# Patient Record
Sex: Male | Born: 1985 | Race: White | Hispanic: No | Marital: Married | Smoking: Former smoker
Health system: Southern US, Community
[De-identification: ages and names within clinical notes are randomized; demographics above are authoritative.]

## PROBLEM LIST (undated history)

## (undated) DIAGNOSIS — I10 Essential (primary) hypertension: Secondary | ICD-10-CM

## (undated) DIAGNOSIS — F329 Major depressive disorder, single episode, unspecified: Secondary | ICD-10-CM

## (undated) DIAGNOSIS — F32A Depression, unspecified: Secondary | ICD-10-CM

## (undated) DIAGNOSIS — M25569 Pain in unspecified knee: Secondary | ICD-10-CM

## (undated) DIAGNOSIS — M549 Dorsalgia, unspecified: Secondary | ICD-10-CM

## (undated) DIAGNOSIS — F319 Bipolar disorder, unspecified: Secondary | ICD-10-CM

## (undated) DIAGNOSIS — F419 Anxiety disorder, unspecified: Secondary | ICD-10-CM

## (undated) HISTORY — DX: Pain in unspecified knee: M25.569

## (undated) HISTORY — PX: TESTICLE SURGERY: SHX794

## (undated) HISTORY — PX: ROTATOR CUFF REPAIR: SHX139

## (undated) HISTORY — DX: Bipolar disorder, unspecified: F31.9

## (undated) HISTORY — DX: Essential (primary) hypertension: I10

## (undated) HISTORY — PX: TONSILLECTOMY: SUR1361

## (undated) HISTORY — DX: Dorsalgia, unspecified: M54.9

---
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
## 2007-03-25 ENCOUNTER — Emergency Department (HOSPITAL_COMMUNITY): Admission: EM | Admit: 2007-03-25 | Discharge: 2007-03-26 | Payer: Self-pay | Admitting: Emergency Medicine

## 2011-05-08 ENCOUNTER — Ambulatory Visit: Payer: Self-pay | Admitting: Family Medicine

## 2011-06-12 ENCOUNTER — Ambulatory Visit (INDEPENDENT_AMBULATORY_CARE_PROVIDER_SITE_OTHER): Payer: 59 | Admitting: Family Medicine

## 2011-06-12 ENCOUNTER — Encounter: Payer: Self-pay | Admitting: Family Medicine

## 2011-06-12 DIAGNOSIS — E291 Testicular hypofunction: Secondary | ICD-10-CM

## 2011-06-12 DIAGNOSIS — Z0000 Encounter for general adult medical examination without abnormal findings: Secondary | ICD-10-CM

## 2011-06-12 LAB — CBC WITH DIFFERENTIAL/PLATELET
Basophils Relative: 0.3 % (ref 0.0–3.0)
Lymphocytes Relative: 24.2 % (ref 12.0–46.0)
Lymphs Abs: 2.2 10*3/uL (ref 0.7–4.0)
Monocytes Absolute: 0.5 10*3/uL (ref 0.1–1.0)
Monocytes Relative: 5.5 % (ref 3.0–12.0)
Neutro Abs: 6.3 10*3/uL (ref 1.4–7.7)
Neutrophils Relative %: 68.4 % (ref 43.0–77.0)
RDW: 12.4 % (ref 11.5–14.6)
WBC: 9.3 10*3/uL (ref 4.5–10.5)

## 2011-06-12 LAB — LIPID PANEL
Cholesterol: 136 mg/dL (ref 0–200)
LDL Cholesterol: 83 mg/dL (ref 0–99)
Total CHOL/HDL Ratio: 4
VLDL: 21.6 mg/dL (ref 0.0–40.0)

## 2011-06-12 LAB — BASIC METABOLIC PANEL
CO2: 26 mEq/L (ref 19–32)
Calcium: 9.1 mg/dL (ref 8.4–10.5)
GFR: 134.33 mL/min (ref >60.00)
Sodium: 138 mEq/L (ref 135–145)

## 2011-06-12 LAB — TSH: TSH: 1.09 u[IU]/mL (ref 0.35–5.50)

## 2011-06-12 LAB — HEPATIC FUNCTION PANEL
AST: 25 U/L (ref 0–37)
Albumin: 3.8 g/dL (ref 3.5–5.2)
Bilirubin, Direct: 0.1 mg/dL (ref 0.0–0.3)
Total Bilirubin: 0.4 mg/dL (ref 0.3–1.2)
Total Protein: 6.6 g/dL (ref 6.0–8.3)

## 2011-06-12 NOTE — Patient Instructions (Addendum)
Testosterone skin gel What is this medicine? TESTOSTERONE (tes TOS ter one) is the main male hormone. It supports normal male traits such as muscle growth, facial hair, and deep voice. This gel is used in males to treat low testosterone levels. This medicine may be used for other purposes; ask your health care provider or pharmacist if you have questions. What should I tell my health care provider before I take this medicine? They need to know if you have any of these conditions: -breast cancer -diabetes -heart disease -if a male partner is pregnant or trying to get pregnant -kidney disease -liver disease -lung disease -prostate cancer, enlargement -an unusual or allergic reaction to testosterone, soy proteins, other medicines, foods, dyes, or preservatives -pregnant or trying to get pregnant -breast-feeding How should I use this medicine? This medicine is for external use only. This medicine is applied at the same time every day (preferably in the morning) to clean, dry, intact skin. If you take a bath or shower in the morning, apply the gel after the bath or shower. Follow the directions on the prescription label. Make sure that you are using your testosterone gel product correctly and applying it only to the appropriate skin area (see below). Allow the skin to dry a few minutes then cover with clothing to prevent others from coming in contact with the medicine on your skin. The gel is flammable. Avoid fire, flame, or smoking until the gel has dried. Wash your hands with soap and water after use. For AndroGel Packets: Open the packet(s) needed for your dose. You can put the entire dose into your palm all at once or just a little at a time to apply. If you prefer, you can instead squeeze the gel directly onto the area you are applying it to. Apply on the shoulders, upper arm, or abdomen as directed. Do not apply to the scrotum or genitals. Be sure you use the correct total dose. It is best to  wait 5 to 6 hours after application of the gel before showering or swimming. For AndroGel 1%: Pump the dose into the palm of your hand. You can put the entire dose into your palm all at once or just a little at a time to apply. If you prefer, you can instead pump the gel directly onto the area you are applying it to. Apply on the shoulders, upper arm, or abdomen as directed. Do not apply to the scrotum or genitals. Be sure you use the correct total dose. It is best to wait for 5 to 6 hours after application of the gel before showering or swimming. For AndroGel 1.62%: Pump the dose into the palm of your hand. Dispense one pump of gel at a time into the palm of your hand before applying it. If you prefer, you can instead pump the gel directly onto the area you are applying it to. Apply on the shoulders and upper arms as directed. Do not apply to other parts of the body including the abdomen or genitals. Be sure you use the correct total dose. It is best to wait 2 hours after application of the gel before washing, showering, or swimming. For Testim: Open the tube(s) needed for your dose. Squeeze the gel from the tube into the palm of your hand. Apply on the shoulders or upper arms as directed. Do not apply to the scrotum, genitals, or abdomen. Be sure you use the correct total dose. Do not shower or swim for at least 2   hours after application of the gel. For Fortesta: Use the multi-dose pump to pump the gel directly onto the area you are applying it to. Apply on the thighs as directed. Do not apply to the abdomen, penis, scrotum, shoulders or upper arms. Gently rub the gel onto the skin using your finger. Be sure you use the correct total dose. Do not shower or swim for at least 2 hours after application of the gel. A special MedGuide will be given to you by the pharmacist with each prescription and refill. Be sure to read this information carefully each time. Talk to your pediatrician regarding the use of this  medicine in children. Special care may be needed. Overdosage: If you think you have taken too much of this medicine contact a poison control center or emergency room at once. NOTE: This medicine is only for you. Do not share this medicine with others. What if I miss a dose? If you miss a dose, use it as soon as you can. If it is almost time for your next dose, use only that dose. Do not use double or extra doses. What may interact with this medicine? -medicines for diabetes -medicines that treat or prevent blood clots like warfarin -oxyphenbutazone -propranolol -steroid medicines like prednisone or cortisone This list may not describe all possible interactions. Give your health care provider a list of all the medicines, herbs, non-prescription drugs, or dietary supplements you use. Also tell them if you smoke, drink alcohol, or use illegal drugs. Some items may interact with your medicine. What should I watch for while using this medicine? Visit your doctor or health care professional for regular checks on your progress. They will need to check the level of testosterone in your blood. This medicine can transfer from your body to others. If a person or pet comes in contact with the area where this medicine was applied to your skin, they may have a serious risk of side effects. If you cannot avoid skin-to-skin contact with another person, make sure the site where this medicine was applied is covered with clothing. If accidental contact happens, the skin of the person or pet should be washed right away with soap and water. Also, a male partner who is pregnant or trying to get pregnant should avoid contact with the gel or treated skin. This medicine may affect blood sugar levels. If you have diabetes, check with your doctor or health care professional before you change your diet or the dose of your diabetic medicine. This drug is banned from use in athletes by most athletic organizations. What side  effects may I notice from receiving this medicine? Side effects that you should report to your doctor or health care professional as soon as possible: -allergic reactions like skin rash, itching or hives, swelling of the face, lips, or tongue -breast enlargement -breathing problems -changes in mood, especially anger, depression, or rage -dark urine -general ill feeling or flu-like symptoms -light-colored stools -loss of appetite, nausea -nausea, vomiting -right upper belly pain -stomach pain -swelling of ankles -too frequent or persistent erections -trouble passing urine or change in the amount of urine -unusually weak or tired -yellowing of the eyes or skin Side effects that usually do not require medical attention (report to your doctor or health care professional if they continue or are bothersome): -acne -change in sex drive or performance -hair loss -headache This list may not describe all possible side effects. Call your doctor for medical advice about side effects. You may   report side effects to FDA at 1-800-FDA-1088. Where should I keep my medicine? Keep out of the reach of children. This medicine can be abused. Keep your medicine in a safe place to protect it from theft. Do not share this medicine with anyone. Selling or giving away this medicine is dangerous and against the law. Store at room temperature between 15 to 30 degrees C (59 to 86 degrees F). Keep closed until use. Protect from heat and light. This medicine is flammable. Avoid exposure to heat, fire, flame, and smoking. Throw away any unused medicine after the expiration date. NOTE: This sheet is a summary. It may not cover all possible information. If you have questions about this medicine, talk to your doctor, pharmacist, or health care provider.  2012, Elsevier/Gold Standard. (08/05/2009 4:45:50 PM)Testosterone This test is used to determine if your testosterone level is abnormal. This could be used to explain  difficulty getting an erection (erectile dysfunction), inability of your partner to get pregnant (infertility), premature or delayed puberty if you are male, or the appearance of masculine physical features if you are male. PREPARATION FOR TEST A blood sample is obtained by inserting a needle into a vein in the arm. NORMAL FINDINGS  Free Testosterone: 0.3-2 pg/mL   % Free Testosterone: 0.1%-0.3%  Total Testosterone:  7 mos-9 yrs (Tanner Stage I)   Male: Less than 30 ng/dL   Male: Less than 30 ng/dL   16-10 yrs (Tanner Stage II)   Male: Less than 300 ng/dL   Male: Less than 40 ng/dL   96-04 yrs (Tanner Stage III)   Male: 170-540 ng/dL   Male: Less than 60 ng/dL   54-09 yrs (Tanner Stage IV, V)   Male: 250-910 ng/dL   Male: Less than 70 ng/dL   20 yrs and over   Male: [id number] ng/dL   Male: Less than 70 ng/dL  Ranges for normal findings may vary among different laboratories and hospitals. You should always check with your doctor after having lab work or other tests done to discuss the meaning of your test results and whether your values are considered within normal limits. MEANING OF TEST  Your caregiver will go over the test results with you and discuss the importance and meaning of your results, as well as treatment options and the need for additional tests if necessary. OBTAINING THE TEST RESULTS It is your responsibility to obtain your test results. Ask the lab or department performing the test when and how you will get your results. Document Released: 04/09/2004 Document Revised: 03/12/2011 Document Reviewed: 03/04/2008 [facility] Patient Information 2012 [place], [place].

## 2011-06-12 NOTE — Assessment & Plan Note (Signed)
Check labs today Will consider restarting androgel when labs are back vs urology referral

## 2011-06-12 NOTE — Progress Notes (Signed)
  Subjective:    Patient ID: Stephen Hartman, male    DOB: 11-21-1985, 26 y.o.   MRN: [id number]  HPI Pt here to establish.  He was dx with low T about 7 months ago and then lost his insurance.   He has been off of the androgel for about 2 months and is interested in starting it again.   Review of Systems    as above--no other complaints. Objective:   Physical Exam  Constitutional: He is oriented to person, place, and time. He appears well-developed and well-nourished.  Neck: Normal range of motion. Neck supple.  Cardiovascular: Normal rate, regular rhythm and normal heart sounds.   No murmur heard. Pulmonary/Chest: No respiratory distress. He has no wheezes. He has no rales.  Neurological: He is alert and oriented to person, place, and time.  Psychiatric: He has a normal mood and affect. His behavior is normal. Judgment and thought content normal.          Assessment & Plan:

## 2011-06-15 ENCOUNTER — Other Ambulatory Visit: Payer: Self-pay | Admitting: Family Medicine

## 2011-06-15 LAB — TESTOSTERONE, FREE, TOTAL, SHBG
Testosterone-% Free: 2.5 % (ref 1.6–2.9)
Testosterone: 212.87 ng/dL — ABNORMAL LOW (ref 250–890)

## 2011-06-18 ENCOUNTER — Other Ambulatory Visit: Payer: Self-pay | Admitting: *Deleted

## 2011-06-18 ENCOUNTER — Encounter: Payer: Self-pay | Admitting: *Deleted

## 2011-06-18 MED ORDER — TESTOSTERONE 20.25 MG/1.25GM (1.62%) TD GEL: 40.5000 g | Freq: Every morning | TRANSDERMAL | Status: DC

## 2011-06-19 ENCOUNTER — Other Ambulatory Visit: Payer: Self-pay | Admitting: Family Medicine

## 2011-06-19 DIAGNOSIS — L210 Seborrhea capitis: Secondary | ICD-10-CM

## 2011-06-19 MED ORDER — FLUOCINOLONE ACETONIDE 0.01 % EX SHAM: MEDICATED_SHAMPOO | CUTANEOUS | Status: DC

## 2011-11-11 ENCOUNTER — Ambulatory Visit: Payer: 59 | Admitting: Endocrinology

## 2011-11-25 ENCOUNTER — Encounter: Payer: Self-pay | Admitting: Endocrinology

## 2011-11-25 ENCOUNTER — Ambulatory Visit (INDEPENDENT_AMBULATORY_CARE_PROVIDER_SITE_OTHER): Payer: 59 | Admitting: Endocrinology

## 2011-11-25 VITALS — BP 118/78 | HR 50 | Temp 97.0°F | Ht 71.0 in | Wt 261.0 lb

## 2011-11-25 DIAGNOSIS — E291 Testicular hypofunction: Secondary | ICD-10-CM | POA: Insufficient documentation

## 2011-11-25 DIAGNOSIS — F329 Major depressive disorder, single episode, unspecified: Secondary | ICD-10-CM

## 2011-11-25 NOTE — Progress Notes (Signed)
Subjective:    Patient ID: Stephen Hartman, male    DOB: [birth year], 26 y.o.   MRN: [id number]  HPI Pt says he was noted to have a low testosterone approx 2 year ago.  He was rx'ed with androgel x approx 6 months.  Pt says he and his wife are planning a pregnancy within the next few years.  He has a few years of slight erectile dysfunction, but no swelling of the legs.  He has associated fatigue. Past Medical History  Diagnosis Date  . Low testosterone     Past Surgical History  Procedure Date  . Testicle surgery     childhood accident    History   Social History  . Marital Status: Married    Spouse Name: N/A    Number of Children: N/A  . Years of Education: N/A   Occupational History  . [employer]    Social History Main Topics  . Smoking status: Never Smoker   . Smokeless tobacco: Not on file  . Alcohol Use: No  . Drug Use: No  . Sexually Active: Yes -- Male partner(s)   Other Topics Concern  . Not on file   Social History Narrative   Exercise--- no    Current Outpatient Prescriptions on File Prior to Visit  Medication Sig Dispense Refill  . Amphetamine-Dextroamphetamine (ADDERALL PO) Take 1 tablet by mouth daily.      . citalopram (CELEXA) 10 MG tablet Take 10 mg by mouth daily.        Allergies  Allergen Reactions  . Erythromycin Nausea And Vomiting    Family History  Problem Relation Age of Onset  . Hypertension Father   . Hypertension Paternal Grandfather   . Stroke Maternal Grandfather    BP 118/78  Pulse 50  Temp 97 F (36.1 C) (Oral)  Ht 5\' 11"  (1.803 m)  Wt 261 lb (118.389 kg)  BMI 36.40 kg/m2  SpO2 97%  Review of Systems denies polyuria, depression, numbness, weight change, decreased urinary stream, gynecomastia, muscle weakness, fever, dysuria, headache, easy bruising, sob, blurry vision, rhinorrhea, chest pain.  He has slight acne on the back of the neck.      Objective:   Physical Exam VS: see vs page GEN: no distress HEAD: head:  no deformity eyes: no periorbital swelling, no proptosis external nose and ears are normal mouth: no lesion seen NECK: supple, thyroid is not enlarged CHEST WALL: no deformity LUNGS: clear to auscultation BREASTS:  bilat pseudogynecomastia CV: reg rate and rhythm, no murmur ABD: abdomen is soft, nontender.  no hepatosplenomegaly.  not distended.  no hernia GENITALIA:  Normal male.   MUSCULOSKELETAL: muscle bulk and strength are grossly normal.  no obvious joint swelling.  gait is normal and steady EXTEMITIES: no deformity.  no ulcer on the feet.  feet are of normal color and temp.  no edema PULSES: dorsalis pedis intact bilat.  no carotid bruit NEURO:  cn 2-12 grossly intact.   readily moves all 4's.  sensation is intact to touch on the feet SKIN:  Normal texture and temperature.  No rash or suspicious lesion is visible.  Normal hair distribution. NODES:  None palpable at the neck PSYCH: alert, oriented x3.  Does not appear anxious nor depressed.   outside test results are reviewed (off testosterone x 3 months) Testosterone=268 Prolactin=6 LH=4     Assessment & Plan:  Idiopathic central hypogonadism, uncertain etiology Depression.  This limits interpretation of sxs of hypogonadism. Weight loss, due to  his efforts.  This helps hypogonadism.  Rather than medication for hypogonadism, he chooses to continue weight-loss instead--i agree with his plan

## 2011-11-25 NOTE — Patient Instructions (Addendum)
Please continue your diet and exercise efforts, to lose weight go back to the lab in a few months, to recheck the testosterone level If it still low, i would be happy to prescribe for you the "clomiphine" pill.

## 2011-11-29 DIAGNOSIS — F418 Other specified anxiety disorders: Secondary | ICD-10-CM | POA: Insufficient documentation

## 2011-12-09 ENCOUNTER — Telehealth: Payer: Self-pay | Admitting: Family Medicine

## 2011-12-09 NOTE — Telephone Encounter (Signed)
on nurse schedule for TB test Monday 9.9.13 at 1015am,  As per pt he is starting a new job and needs one for that reason only

## 2011-12-09 NOTE — Telephone Encounter (Signed)
Ok to do PPD

## 2011-12-09 NOTE — Telephone Encounter (Signed)
Last seen 06/12/11 Please advise if it is ok to do the TB skin test.      KP

## 2011-12-14 ENCOUNTER — Ambulatory Visit: Payer: 59

## 2011-12-14 ENCOUNTER — Ambulatory Visit (INDEPENDENT_AMBULATORY_CARE_PROVIDER_SITE_OTHER): Payer: 59

## 2011-12-14 DIAGNOSIS — Z209 Contact with and (suspected) exposure to unspecified communicable disease: Secondary | ICD-10-CM

## 2011-12-17 LAB — TB SKIN TEST
Induration: 0 mm
TB Skin Test: NEGATIVE

## 2012-02-03 ENCOUNTER — Telehealth: Payer: Self-pay | Admitting: *Deleted

## 2012-02-03 NOTE — Telephone Encounter (Signed)
Fax received from [facility] requesting office note for scheduled appt on 11/25/11. Notes faxed to [phone].

## 2012-12-22 ENCOUNTER — Ambulatory Visit (INDEPENDENT_AMBULATORY_CARE_PROVIDER_SITE_OTHER): Payer: 59 | Admitting: Nurse Practitioner

## 2012-12-22 ENCOUNTER — Encounter: Payer: Self-pay | Admitting: Nurse Practitioner

## 2012-12-22 VITALS — BP 137/80 | HR 62 | Temp 98.0°F | Ht 70.75 in | Wt 311.1 lb

## 2012-12-22 DIAGNOSIS — Z23 Encounter for immunization: Secondary | ICD-10-CM

## 2012-12-22 NOTE — Progress Notes (Signed)
Pt is here for review of immnunizations and paper work for college (1 page). Last physical 06/2011. Health problems at that time were anxiety/depression and hypogonadism. Pt was on celexa for depression. He stopped this medicine 1 year ago. Depressive symptoms have not returned. Pt denies suicidal ideation. He is seeing endocrine for low testosterone management. According to USPSTF recommendations, he should have a physical every 2-5 years. Given that he has no new complaints, no physical completed today. Immunizations are reviewed by [place] and office records.  There are only 3 polio vaccines recorded on [facility]. Pt would like to contact historical provider before he receives vaccine. 1 page form completed, copied, and returned to pt.

## 2012-12-22 NOTE — Patient Instructions (Signed)
Consider scheduling a physical within the next 6 months. Nice to meet you!

## 2013-01-04 ENCOUNTER — Encounter: Payer: Self-pay | Admitting: Family Medicine

## 2013-01-04 ENCOUNTER — Ambulatory Visit (INDEPENDENT_AMBULATORY_CARE_PROVIDER_SITE_OTHER): Payer: BC Managed Care – PPO | Admitting: Family Medicine

## 2013-01-04 VITALS — BP 118/74 | HR 68 | Temp 98.4°F | Wt 310.6 lb

## 2013-01-04 DIAGNOSIS — F988 Other specified behavioral and emotional disorders with onset usually occurring in childhood and adolescence: Secondary | ICD-10-CM

## 2013-01-04 DIAGNOSIS — F341 Dysthymic disorder: Secondary | ICD-10-CM

## 2013-01-04 DIAGNOSIS — F418 Other specified anxiety disorders: Secondary | ICD-10-CM

## 2013-01-04 DIAGNOSIS — Z23 Encounter for immunization: Secondary | ICD-10-CM

## 2013-01-04 DIAGNOSIS — F329 Major depressive disorder, single episode, unspecified: Secondary | ICD-10-CM

## 2013-01-04 MED ORDER — AMPHETAMINE-DEXTROAMPHETAMINE 30 MG PO TABS: 30.0000 mg | ORAL_TABLET | Freq: Every day | ORAL | Status: DC

## 2013-01-04 MED ORDER — AMPHETAMINE-DEXTROAMPHETAMINE 15 MG PO TABS: 15.0000 mg | ORAL_TABLET | Freq: Two times a day (BID) | ORAL | Status: DC

## 2013-01-04 MED ORDER — LAMOTRIGINE 100 MG PO TABS: 100.0000 mg | ORAL_TABLET | Freq: Every day | ORAL | Status: DC

## 2013-01-04 MED ORDER — CITALOPRAM HYDROBROMIDE 20 MG PO TABS: 20.0000 mg | ORAL_TABLET | Freq: Every day | ORAL | Status: DC

## 2013-01-04 MED ORDER — AMPHETAMINE-DEXTROAMPHET ER 15 MG PO CP24: 15.0000 mg | ORAL_CAPSULE | Freq: Two times a day (BID) | ORAL | Status: DC

## 2013-01-04 NOTE — Assessment & Plan Note (Signed)
Refill adderall rto 1 month

## 2013-01-04 NOTE — Addendum Note (Signed)
Addended by: Arnette Norris on: 01/04/2013 11:34 AM   Modules accepted: Orders, Medications

## 2013-01-04 NOTE — Assessment & Plan Note (Signed)
Start celexa 20 mg  1/2 tab po qd for 8 days then increase to 1 po qd  con't lamictal

## 2013-01-04 NOTE — Progress Notes (Signed)
  Subjective:    Patient ID: Stephen Hartman, male    DOB: 11/12/1985, 27 y.o.   MRN: [id number]  HPI Pt here to f/u add and depression/ anxiety.  He would like [name] to fill meds.  No new complaints except he is out of all meds.   Review of Systems As above    Objective:   Physical Exam  BP 118/74  Pulse 68  Temp(Src) 98.4 F (36.9 C) (Oral)  Wt 310 lb 9.6 oz (140.887 kg)  BMI 43.63 kg/m2  SpO2 97% General appearance: alert, cooperative, appears stated age and no distress Lungs: clear to auscultation bilaterally Heart: S1, S2 normal Neurologic: Alert and oriented X 3, normal strength and tone. Normal symmetric reflexes. Normal coordination and gait Psych--normal affect , pt speaks fast and is anxious, he is not suicidal      Assessment & Plan:

## 2013-01-04 NOTE — Patient Instructions (Signed)

## 2013-03-06 ENCOUNTER — Telehealth: Payer: Self-pay

## 2013-03-06 NOTE — Telephone Encounter (Signed)
Medication List and allergies: reviewed and updated  90 day supply/mail order: na Local prescriptions: [facility]  Immunizations due: UTD  A/P:   No changes to FH or PSH Tdap--06/2009 Flu vaccine---01/2013 PSA--06/2011--0.38  To Discuss with Provider: Adderall should be 30 mg XR instead of regular release

## 2013-03-07 ENCOUNTER — Encounter: Payer: BC Managed Care – PPO | Admitting: Family Medicine

## 2013-03-07 DIAGNOSIS — Z0289 Encounter for other administrative examinations: Secondary | ICD-10-CM

## 2013-03-24 ENCOUNTER — Ambulatory Visit (INDEPENDENT_AMBULATORY_CARE_PROVIDER_SITE_OTHER): Payer: BC Managed Care – PPO | Admitting: Family Medicine

## 2013-03-24 ENCOUNTER — Encounter: Payer: Self-pay | Admitting: Family Medicine

## 2013-03-24 VITALS — BP 142/96 | HR 111 | Temp 98.4°F | Wt 326.0 lb

## 2013-03-24 DIAGNOSIS — R030 Elevated blood-pressure reading, without diagnosis of hypertension: Secondary | ICD-10-CM

## 2013-03-24 DIAGNOSIS — F988 Other specified behavioral and emotional disorders with onset usually occurring in childhood and adolescence: Secondary | ICD-10-CM

## 2013-03-24 MED ORDER — AMPHETAMINE-DEXTROAMPHET ER 30 MG PO CP24: 30.0000 mg | ORAL_CAPSULE | ORAL | Status: DC

## 2013-03-24 NOTE — Assessment & Plan Note (Signed)
Refill adderall xR rto 6 months

## 2013-03-24 NOTE — Progress Notes (Signed)
   Subjective:    Patient ID: Stephen Hartman, male    DOB: [birth year], 27 y.o.   MRN: [id number]  HPI Pt here to f/u ADD --- no complaints except the RX accidentally got switched to ADDERALL instead of Adderall XR.       Review of Systems As above    Objective:   Physical Exam  BP 142/96  Pulse 113  Temp(Src) 98.4 F (36.9 C) (Oral)  Wt 326 lb (147.873 kg)  SpO2 98% General appearance: alert, cooperative, appears stated age and no distress Lungs: clear to auscultation bilaterally Heart: S1, S2 normal      Assessment & Plan:

## 2013-03-24 NOTE — Patient Instructions (Signed)
Attention Deficit Hyperactivity Disorder Attention deficit hyperactivity disorder (ADHD) is a problem with behavior issues based on the way the brain functions (neurobehavioral disorder). It is a common reason for behavior and academic problems in school. CAUSES  The cause of ADHD is unknown in most cases. It may run in families. It sometimes can be associated with learning disabilities and other behavioral problems. SYMPTOMS  There are 3 types of ADHD. The 3 types and some of the symptoms include:  Inattentive  Gets bored or distracted easily.  Loses or forgets things. Forgets to hand in homework.  Has trouble organizing or completing tasks.  Difficulty staying on task.  An inability to organize daily tasks and school work.  Leaving projects, chores, or homework unfinished.  Trouble paying attention or responding to details. Careless mistakes.  Difficulty following directions. Often seems like is not listening.  Dislikes activities that require sustained attention (like chores or homework).  Hyperactive-impulsive  Feels like it is impossible to sit still or stay in a seat. Fidgeting with hands and feet.  Trouble waiting turn.  Talking too much or out of turn. Interruptive.  Speaks or acts impulsively.  Aggressive, disruptive behavior.  Constantly busy or on the go, noisy.  Combined  Has symptoms of both of the above. Often children with ADHD feel discouraged about themselves and with school. They often perform well below their abilities in school. These symptoms can cause problems in home, school, and in relationships with peers. As children get older, the excess motor activities can calm down, but the problems with paying attention and staying organized persist. Most children do not outgrow ADHD but with good treatment can learn to cope with the symptoms. DIAGNOSIS  When ADHD is suspected, the diagnosis should be made by professionals trained in ADHD.  Diagnosis will  include:  Ruling out other reasons for the child's behavior.  The caregivers will check with the child's school and check their medical records.  They will talk to teachers and parents.  Behavior rating scales for the child will be filled out by those dealing with the child on a daily basis. A diagnosis is made only after all information has been considered. TREATMENT  Treatment usually includes behavioral treatment often along with medicines. It may include stimulant medicines. The stimulant medicines decrease impulsivity and hyperactivity and increase attention. Other medicines used include antidepressants and certain blood pressure medicines. Most experts agree that treatment for ADHD should address all aspects of the child's functioning. Treatment should not be limited to the use of medicines alone. Treatment should include structured classroom management. The parents must receive education to address rewarding good behavior, discipline, and limit-setting. Tutoring or behavioral therapy or both should be available for the child. If untreated, the disorder can have long-term serious effects into adolescence and adulthood. HOME CARE INSTRUCTIONS   Often with ADHD there is a lot of frustration among the family in dealing with the illness. There is often blame and anger that is not warranted. This is a life long illness. There is no way to prevent ADHD. In many cases, because the problem affects the family as a whole, the entire family may need help. A therapist can help the family find better ways to handle the disruptive behaviors and promote change. If the child is young, most of the therapist's work is with the parents. Parents will learn techniques for coping with and improving their child's behavior. Sometimes only the child with the ADHD needs counseling. Your caregivers can help   you make these decisions.  Children with ADHD may need help in organizing. Some helpful tips include:  Keep  routines the same every day from wake-up time to bedtime. Schedule everything. This includes homework and playtime. This should include outdoor and indoor recreation. Keep the schedule on the refrigerator or a bulletin board where it is frequently seen. Mark schedule changes as far in advance as possible.  Have a place for everything and keep everything in its place. This includes clothing, backpacks, and school supplies.  Encourage writing down assignments and bringing home needed books.  Offer your child a well-balanced diet. Breakfast is especially important for school performance. Children should avoid drinks with caffeine including:  Soft drinks.  Coffee.  Tea.  However, some older children (adolescents) may find these drinks helpful in improving their attention.  Children with ADHD need consistent rules that they can understand and follow. If rules are followed, give small rewards. Children with ADHD often receive, and expect, criticism. Look for good behavior and praise it. Set realistic goals. Give clear instructions. Look for activities that can foster success and self-esteem. Make time for pleasant activities with your child. Give lots of affection.  Parents are their children's greatest advocates. Learn as much as possible about ADHD. This helps you become a stronger and better advocate for your child. It also helps you educate your child's teachers and instructors if they feel inadequate in these areas. Parent support groups are often helpful. A national group with local chapters is called CHADD (Children and Adults with Attention Deficit Hyperactivity Disorder). PROGNOSIS  There is no cure for ADHD. Children with the disorder seldom outgrow it. Many find adaptive ways to accommodate the ADHD as they mature. SEEK MEDICAL CARE IF:  Your child has repeated muscle twitches, cough or speech outbursts.  Your child has sleep problems.  Your child has a marked loss of  appetite.  Your child develops depression.  Your child has new or worsening behavioral problems.  Your child develops dizziness.  Your child has a racing heart.  Your child has stomach pains.  Your child develops headaches. Document Released: 03/13/2002 Document Revised: 06/15/2011 Document Reviewed: 10/12/2012 [employer] Patient Information 2014 [employer].  

## 2013-03-24 NOTE — Assessment & Plan Note (Signed)
Pt was very stressed coming in today because he was late. Watch salt Diet and exercise rto 2-3 weeks for re check

## 2013-03-24 NOTE — Progress Notes (Signed)
Pre visit review using our clinic review tool, if applicable. No additional management support is needed unless otherwise documented below in the visit note. 

## 2013-04-14 ENCOUNTER — Ambulatory Visit: Payer: BC Managed Care – PPO | Admitting: Family Medicine

## 2013-05-17 ENCOUNTER — Other Ambulatory Visit: Payer: Self-pay | Admitting: Family Medicine

## 2013-05-24 ENCOUNTER — Encounter: Payer: BC Managed Care – PPO | Admitting: Family Medicine

## 2013-07-18 ENCOUNTER — Ambulatory Visit: Payer: 59 | Admitting: Family Medicine

## 2013-07-21 ENCOUNTER — Telehealth: Payer: Self-pay

## 2013-07-21 NOTE — Telephone Encounter (Signed)
Left message for call back Identifiable  Flu- 01/04/13 Td- 06/11/09 PSA- 06/12/11- 0.38

## 2013-07-24 ENCOUNTER — Encounter: Payer: 59 | Admitting: Family Medicine

## 2013-07-24 DIAGNOSIS — Z0289 Encounter for other administrative examinations: Secondary | ICD-10-CM

## 2013-07-31 NOTE — Telephone Encounter (Signed)
No show

## 2013-10-05 ENCOUNTER — Encounter: Payer: Self-pay | Admitting: Family Medicine

## 2013-10-05 ENCOUNTER — Ambulatory Visit (INDEPENDENT_AMBULATORY_CARE_PROVIDER_SITE_OTHER): Payer: 59 | Admitting: Family Medicine

## 2013-10-05 VITALS — BP 120/84 | HR 75 | Temp 98.3°F | Wt 339.0 lb

## 2013-10-05 DIAGNOSIS — F32A Depression, unspecified: Secondary | ICD-10-CM

## 2013-10-05 DIAGNOSIS — F988 Other specified behavioral and emotional disorders with onset usually occurring in childhood and adolescence: Secondary | ICD-10-CM

## 2013-10-05 DIAGNOSIS — F3289 Other specified depressive episodes: Secondary | ICD-10-CM

## 2013-10-05 DIAGNOSIS — F329 Major depressive disorder, single episode, unspecified: Secondary | ICD-10-CM

## 2013-10-05 MED ORDER — CITALOPRAM HYDROBROMIDE 20 MG PO TABS: ORAL_TABLET | ORAL | Status: DC

## 2013-10-05 MED ORDER — AMPHETAMINE-DEXTROAMPHET ER 30 MG PO CP24: 30.0000 mg | ORAL_CAPSULE | ORAL | Status: DC

## 2013-10-05 MED ORDER — LAMOTRIGINE 100 MG PO TABS: 100.0000 mg | ORAL_TABLET | Freq: Every day | ORAL | Status: DC

## 2013-10-05 NOTE — Patient Instructions (Signed)
Depression, Adult Depression refers to feeling sad, low, down in the dumps, blue, gloomy, or empty. In general, there are two kinds of depression: 1. Depression that we all experience from time to time because of upsetting life experiences, including the loss of a job or the ending of a relationship (normal sadness or normal grief). This kind of depression is considered normal, is short lived, and resolves within a few days to 2 weeks. (Depression experienced after the loss of a loved one is called bereavement. Bereavement often lasts longer than 2 weeks but normally gets better with time.) 2. Clinical depression, which lasts longer than normal sadness or normal grief or interferes with your ability to function at home, at work, and in school. It also interferes with your personal relationships. It affects almost every aspect of your life. Clinical depression is an illness. Symptoms of depression also can be caused by conditions other than normal sadness and grief or clinical depression. Examples of these conditions are listed as follows:  Physical illness--Some physical illnesses, including underactive thyroid gland (hypothyroidism), severe anemia, specific types of cancer, diabetes, uncontrolled seizures, heart and lung problems, strokes, and chronic pain are commonly associated with symptoms of depression.  Side effects of some prescription medicine--In some people, certain types of prescription medicine can cause symptoms of depression.  Substance abuse--Abuse of alcohol and illicit drugs can cause symptoms of depression. SYMPTOMS Symptoms of normal sadness and normal grief include the following:  Feeling sad or crying for short periods of time.  Not caring about anything (apathy).  Difficulty sleeping or sleeping too much.  No longer able to enjoy the things you used to enjoy.  Desire to be by oneself all the time (social isolation).  Lack of energy or motivation.  Difficulty  concentrating or remembering.  Change in appetite or weight.  Restlessness or agitation. Symptoms of clinical depression include the same symptoms of normal sadness or normal grief and also the following symptoms:  Feeling sad or crying all the time.  Feelings of guilt or worthlessness.  Feelings of hopelessness or helplessness.  Thoughts of suicide or the desire to harm yourself (suicidal ideation).  Loss of touch with reality (psychotic symptoms). Seeing or hearing things that are not real (hallucinations) or having false beliefs about your life or the people around you (delusions and paranoia). DIAGNOSIS  The diagnosis of clinical depression usually is based on the severity and duration of the symptoms. Your caregiver also will ask you questions about your medical history and substance use to find out if physical illness, use of prescription medicine, or substance abuse is causing your depression. Your caregiver also may order blood tests. TREATMENT  Typically, normal sadness and normal grief do not require treatment. However, sometimes antidepressant medicine is prescribed for bereavement to ease the depressive symptoms until they resolve. The treatment for clinical depression depends on the severity of your symptoms but typically includes antidepressant medicine, counseling with a mental health professional, or a combination of both. Your caregiver will help to determine what treatment is best for you. Depression caused by physical illness usually goes away with appropriate medical treatment of the illness. If prescription medicine is causing depression, talk with your caregiver about stopping the medicine, decreasing the dose, or substituting another medicine. Depression caused by abuse of alcohol or illicit drugs abuse goes away with abstinence from these substances. Some adults need professional help in order to stop drinking or using drugs. SEEK IMMEDIATE CARE IF:  You have thoughts  about   hurting yourself or others.  You lose touch with reality (have psychotic symptoms).  You are taking medicine for depression and have a serious side effect. FOR MORE INFORMATION National Alliance on Mental Illness: www.nami.org National Institute of Mental Health: www.nimh.nih.gov Document Released: 03/20/2000 Document Revised: 09/22/2011 Document Reviewed: 06/22/2011 ExitCare Patient Information 2015 [employer]. This information is not intended to replace advice given to you by your health care provider. Make sure you discuss any questions you have with your health care provider.  

## 2013-10-05 NOTE — Progress Notes (Signed)
   Subjective:    Patient ID: Stephen Hartman, male    DOB: 13-Nov-1985, 28 y.o.   MRN: [id number]  HPI Pt here f/u depression and add.  He has an appointment with new psychiatrist but has run out of lamictal and will nees a refill before he sees them.     Review of Systems As above     Objective:   Physical Exam BP 120/84  Pulse 75  Temp(Src) 98.3 F (36.8 C) (Oral)  Wt 339 lb (153.769 kg)  SpO2 98% General appearance: alert, cooperative, appears stated age and no distress Lungs: clear to auscultation bilaterally Heart: regular rate and rhythm, S1, S2 normal, no murmur, click, rub or gallop       Assessment & Plan:  1. ADD (attention deficit disorder) Stable--- recheck 6 months - amphetamine-dextroamphetamine (ADDERALL XR) 30 MG 24 hr capsule; Take 1 capsule (30 mg total) by mouth every morning.  Dispense: 30 capsule; Refill: 0 - amphetamine-dextroamphetamine (ADDERALL XR) 30 MG 24 hr capsule; Take 1 capsule (30 mg total) by mouth every morning.  Dispense: 30 capsule; Refill: 0 - amphetamine-dextroamphetamine (ADDERALL XR) 30 MG 24 hr capsule; Take 1 capsule (30 mg total) by mouth every morning.  Dispense: 30 capsule; Refill: 0  2. Depression F/u psych--will refill meds today so he does not run out - lamoTRIgine (LAMICTAL) 100 MG tablet; Take 1 tablet (100 mg total) by mouth daily.  Dispense: 30 tablet; Refill: 5 - citalopram (CELEXA) 20 MG tablet; TAKE 1 TABLET BY MOUTH DAILY  Dispense: 30 tablet; Refill: 5

## 2013-10-05 NOTE — Progress Notes (Signed)
Pre visit review using our clinic review tool, if applicable. No additional management support is needed unless otherwise documented below in the visit note. 

## 2014-01-01 ENCOUNTER — Ambulatory Visit: Payer: 59 | Admitting: Medical

## 2014-01-01 ENCOUNTER — Telehealth: Payer: Self-pay | Admitting: *Deleted

## 2014-01-01 DIAGNOSIS — Z0289 Encounter for other administrative examinations: Secondary | ICD-10-CM

## 2014-01-01 NOTE — Telephone Encounter (Signed)
Pt did not show for appointment 01/01/2014 at 10:00am for follow up/[place] pt

## 2014-01-03 ENCOUNTER — Encounter: Payer: Self-pay | Admitting: Medical

## 2014-01-03 ENCOUNTER — Ambulatory Visit (INDEPENDENT_AMBULATORY_CARE_PROVIDER_SITE_OTHER): Payer: 59 | Admitting: Medical

## 2014-01-03 VITALS — BP 137/81 | HR 110 | Temp 98.4°F | Ht 65.11 in | Wt 333.8 lb

## 2014-01-03 DIAGNOSIS — F341 Dysthymic disorder: Secondary | ICD-10-CM

## 2014-01-03 DIAGNOSIS — F988 Other specified behavioral and emotional disorders with onset usually occurring in childhood and adolescence: Secondary | ICD-10-CM

## 2014-01-03 DIAGNOSIS — F32A Depression, unspecified: Secondary | ICD-10-CM

## 2014-01-03 DIAGNOSIS — R000 Tachycardia, unspecified: Secondary | ICD-10-CM | POA: Insufficient documentation

## 2014-01-03 DIAGNOSIS — F418 Other specified anxiety disorders: Secondary | ICD-10-CM

## 2014-01-03 DIAGNOSIS — F3289 Other specified depressive episodes: Secondary | ICD-10-CM

## 2014-01-03 DIAGNOSIS — F329 Major depressive disorder, single episode, unspecified: Secondary | ICD-10-CM

## 2014-01-03 MED ORDER — AMPHETAMINE-DEXTROAMPHET ER 30 MG PO CP24: ORAL_CAPSULE | ORAL | Status: DC

## 2014-01-03 MED ORDER — CITALOPRAM HYDROBROMIDE 20 MG PO TABS: ORAL_TABLET | ORAL | Status: DC

## 2014-01-03 MED ORDER — LAMOTRIGINE 100 MG PO TABS: 100.0000 mg | ORAL_TABLET | Freq: Every day | ORAL | Status: DC

## 2014-01-03 NOTE — Assessment & Plan Note (Signed)
Discussion/counseled today on weight loss. Advised consider possible new med in future such as Belviq but would want to discuss with Dr. Etter Sjogren since she is his pcp.

## 2014-01-03 NOTE — Progress Notes (Signed)
   Subjective:    Patient ID: Stephen Hartman, male    DOB: Aug 21, 1985, 28 y.o.   MRN: [id number]  HPI   Pt in for follow up on med refills.  Pt states in for follow up on ADD and touch base on his mood. Pt has been on adderall for 2 years. Pt states no chest pain or palpitations. Pt pulse up a little initially today when in. He drinks one caffeinated beverage a day. On review his pulse is not always up on his office visits.  Pt states his mood feels stable. No depression presently. Last saw psychiatrist one month ago.      Review of Systems  Constitutional: Negative for fever, chills and fatigue.  HENT: Negative.   Respiratory: Negative for cough, choking, chest tightness, shortness of breath and wheezing.   Cardiovascular: Negative for chest pain and palpitations.  Gastrointestinal: Negative.   Genitourinary: Negative.   Musculoskeletal: Negative.   Hematological: Negative for adenopathy. Does not bruise/bleed easily.  Psychiatric/Behavioral: Negative for suicidal ideas, hallucinations, behavioral problems, confusion, sleep disturbance, self-injury, dysphoric mood and decreased concentration. The patient is not nervous/anxious and is not hyperactive.        Both his mood and concentration very good now. No problems reported.       Objective:   Physical Exam  General Mental Status- Alert. General Appearance- Not in acute distress.   Skin General: Color- Normal Color. Moisture- Normal Moisture.  Neck Carotid Arteries- Normal color. Moisture- Normal Moisture. No carotid bruits. No JVD.  Chest and Lung Exam Auscultation: Breath Sounds:-Normal.  Cardiovascular Auscultation:Rythm- Regular. Murmurs & Other Heart Sounds:Auscultation of the heart reveals- No Murmurs.  Abdomen Inspection:-Inspeection Normal. Palpation/Percussion:Note:No mass. Palpation and Percussion of the abdomen reveal- Non Tender, Non Distended + BS, no rebound or guarding.    Neurologic Cranial  Nerve exam:- CN III-XII intact(No nystagmus), symmetric smile. Romberg Exam:- Negative.  Strength:- 5/5 equal and symmetric strength both upper and lower extremities.       Assessment & Plan:

## 2014-01-03 NOTE — Patient Instructions (Signed)
I am refilling your adderall at same dose. I want you to check your pulse(at your work) various times of the day and notify me. I want to know if always above 100. Also reduce your caffeine intake.   Call us in one month for prescription refill of adderall. You do not have to see me to get the prescription. Just pick it up at front.   Please get uds today.

## 2014-01-03 NOTE — Assessment & Plan Note (Signed)
Stable and refill his lamcital and celexa.

## 2014-01-03 NOTE — Assessment & Plan Note (Signed)
Pt concentration is good. Refilled his med for one month. I discussed with his pharmacist. She advised against post dating rx per laws. He can pick up rx here in 1 month. No appointment needed just call.  UDS done today.

## 2014-01-03 NOTE — Assessment & Plan Note (Signed)
Mild on exam and not always per flow sheet in chart. So I want him to cut back on caffeine and check his pulse at work.(He works in [place]).  If always over 100 would consider dose reduction of add med particular if he is ever symptomatic. If his bp levels were over 110 would consider getting baseline ekg.

## 2014-02-01 ENCOUNTER — Telehealth: Payer: Self-pay | Admitting: Family Medicine

## 2014-02-01 DIAGNOSIS — F988 Other specified behavioral and emotional disorders with onset usually occurring in childhood and adolescence: Secondary | ICD-10-CM

## 2014-02-01 DIAGNOSIS — F32A Depression, unspecified: Secondary | ICD-10-CM

## 2014-02-01 DIAGNOSIS — F329 Major depressive disorder, single episode, unspecified: Secondary | ICD-10-CM

## 2014-02-01 MED ORDER — AMPHETAMINE-DEXTROAMPHET ER 30 MG PO CP24: ORAL_CAPSULE | ORAL | Status: DC

## 2014-02-01 MED ORDER — LAMOTRIGINE 100 MG PO TABS: 100.0000 mg | ORAL_TABLET | Freq: Every day | ORAL | Status: DC

## 2014-02-01 MED ORDER — AMPHETAMINE-DEXTROAMPHET ER 30 MG PO CP24: 30.0000 mg | ORAL_CAPSULE | ORAL | Status: DC

## 2014-02-01 MED ORDER — CITALOPRAM HYDROBROMIDE 20 MG PO TABS: ORAL_TABLET | ORAL | Status: DC

## 2014-02-01 NOTE — Telephone Encounter (Signed)
Patient aware Rx ready for pick up.      KP 

## 2014-02-01 NOTE — Telephone Encounter (Signed)
Caller name: Ezariah  Call back number:[phone] Pharmacy: [facility]  Reason for call:  Requesting refills on Rx :  amphetamine-dextroamphetamine (ADDERALL XR) 30 MG 24 hr  citalopram (CELEXA) 20 MG tablet  lamoTRIgine (LAMICTAL) 100 MG tablet

## 2014-04-09 ENCOUNTER — Ambulatory Visit: Payer: 59 | Admitting: Family Medicine

## 2014-04-13 ENCOUNTER — Ambulatory Visit: Payer: 59 | Admitting: Family Medicine

## 2014-04-20 ENCOUNTER — Ambulatory Visit: Payer: 59 | Admitting: Family Medicine

## 2014-04-20 ENCOUNTER — Telehealth: Payer: Self-pay | Admitting: *Deleted

## 2014-04-20 NOTE — Telephone Encounter (Signed)
See note below

## 2014-04-20 NOTE — Telephone Encounter (Signed)
Pt did not show for appointment 04/20/14 at 1:00pm for "6 mo fu / add/needs FMLA forms filled out"  Charge no show fee?

## 2014-04-20 NOTE — Telephone Encounter (Signed)
yes

## 2014-05-04 ENCOUNTER — Ambulatory Visit: Payer: 59 | Admitting: Family Medicine

## 2014-05-07 ENCOUNTER — Ambulatory Visit (INDEPENDENT_AMBULATORY_CARE_PROVIDER_SITE_OTHER): Payer: 59 | Admitting: Family Medicine

## 2014-05-07 ENCOUNTER — Encounter: Payer: Self-pay | Admitting: Family Medicine

## 2014-05-07 VITALS — BP 142/94 | HR 104 | Temp 98.1°F | Wt 323.0 lb

## 2014-05-07 DIAGNOSIS — R002 Palpitations: Secondary | ICD-10-CM

## 2014-05-07 DIAGNOSIS — R000 Tachycardia, unspecified: Secondary | ICD-10-CM

## 2014-05-07 DIAGNOSIS — F988 Other specified behavioral and emotional disorders with onset usually occurring in childhood and adolescence: Secondary | ICD-10-CM

## 2014-05-07 DIAGNOSIS — F909 Attention-deficit hyperactivity disorder, unspecified type: Secondary | ICD-10-CM

## 2014-05-07 MED ORDER — ATOMOXETINE HCL 80 MG PO CAPS: 80.0000 mg | ORAL_CAPSULE | Freq: Every day | ORAL | Status: DC

## 2014-05-07 NOTE — Progress Notes (Signed)
Pre visit review using our clinic review tool, if applicable. No additional management support is needed unless otherwise documented below in the visit note. 

## 2014-05-07 NOTE — Assessment & Plan Note (Signed)
strattera starter pack then 80 mg daily rto 3 months or sooner prn

## 2014-05-07 NOTE — Patient Instructions (Signed)

## 2014-05-07 NOTE — Progress Notes (Signed)
-    HPI  Patient here for add f/u-- he has been having palpitations and headaches and bp and hr have been high.    Past Medical History  Diagnosis Date  . Low testosterone     Review of Systems  Constitutional: Negative for fatigue and unexpected weight change.  Respiratory: Negative for cough and shortness of breath.   Cardiovascular: Positive for palpitations. Negative for chest pain.  Psychiatric/Behavioral: Positive for decreased concentration. Negative for suicidal ideas, hallucinations, behavioral problems, confusion, sleep disturbance, self-injury, dysphoric mood and agitation. The patient is hyperactive. The patient is not nervous/anxious.        Objective:    Physical Exam  Constitutional: He is oriented to person, place, and time. He appears well-developed and well-nourished. No distress.  Cardiovascular: Normal rate, regular rhythm and normal heart sounds.   No murmur heard. Pulmonary/Chest: Effort normal and breath sounds normal. No respiratory distress. He has no wheezes. He has no rales. He exhibits no tenderness.  Neurological: He is alert and oriented to person, place, and time.  Psychiatric: He has a normal mood and affect. His behavior is normal. Judgment and thought content normal.   Original pulse 200 --- came down to 104 with rest BP 142/94 mmHg  Pulse 104  Temp(Src) 98.1 F (36.7 C) (Oral)  Wt 323 lb (146.512 kg)  SpO2 99% Wt Readings from Last 3 Encounters:  05/07/14 323 lb (146.512 kg)  01/03/14 333 lb 12.8 oz (151.411 kg)  10/05/13 339 lb (153.769 kg)     Lab Results  Component Value Date   WBC 9.3 06/12/2011   HGB 15.3 06/12/2011   HCT 44.6 06/12/2011   PLT 284.0 06/12/2011   GLUCOSE 97 06/12/2011   CHOL 136 06/12/2011   TRIG 108.0 06/12/2011   HDL 31.40* 06/12/2011   LDLCALC 83 06/12/2011   ALT 32 06/12/2011   AST 25 06/12/2011   NA 138 06/12/2011   K 3.8 06/12/2011   CL 104 06/12/2011   CREATININE 0.8 06/12/2011   BUN 15  06/12/2011   CO2 26 06/12/2011   TSH 1.09 06/12/2011   PSA 0.38 06/12/2011    Dg Shoulder Right  03/26/2007   History: Shoulder dislocation status post reduction   PORTABLE RIGHT SHOULDER 2 VIEWS:   Portable exam compared to prior study of 0024 hours.   Glenohumeral dislocation reduced. Scattered technical artifacts. No definite fracture seen. AC joint alignment normal.   IMPRESSION: Reduction of glenohumeral dislocation.  Provider: Dyke Brackett  Dg Shoulder Right  03/26/2007   History: Right shoulder pain, prior history dislocations   RIGHT SHOULDER 2 VIEWS:   Inferior glenohumeral dislocation. AC joint alignment normal. No fracture or bone destruction. Visualized right ribs intact.   IMPRESSION: Inferior right glenohumeral dislocation.  Provider: Emeline [facility]      Assessment & Plan:   Problem List Items Addressed This Visit    Tachycardia    May be from adderall---d/c adderall and try strattera      ADD (attention deficit disorder) - Primary    strattera starter pack then 80 mg daily rto 3 months or sooner prn      Relevant Medications   STRATTERA 80 MG PO CAPS    Other Visit Diagnoses    Palpitations        Relevant Orders    EKG 12-Lead (Completed)        Garnet Koyanagi, DO

## 2014-05-07 NOTE — Assessment & Plan Note (Signed)
May be from adderall---d/c adderall and try strattera

## 2014-05-17 ENCOUNTER — Ambulatory Visit: Payer: 59 | Admitting: Family Medicine

## 2014-08-06 ENCOUNTER — Ambulatory Visit: Payer: 59 | Admitting: Family Medicine

## 2015-04-19 DIAGNOSIS — F3181 Bipolar II disorder: Secondary | ICD-10-CM | POA: Diagnosis not present

## 2015-04-19 MED FILL — lamoTRIgine 150 MG TABS: 150 | 30 days supply | Qty: 30 | Fill #1

## 2015-04-19 MED FILL — BUPROPION HCL XL 150 MG TAB: 150 | 30 days supply | Qty: 90 | Fill #1

## 2015-05-21 MED FILL — lamoTRIgine 150 MG TABS: 150 | 30 days supply | Qty: 30 | Fill #0

## 2015-05-21 MED FILL — BUPROPION HCL XL 150 MG TAB: 150 | 30 days supply | Qty: 90 | Fill #0

## 2015-06-17 ENCOUNTER — Telehealth: Payer: Self-pay | Admitting: Family Medicine

## 2015-06-17 NOTE — Telephone Encounter (Signed)
Caller name:Maddie Relationship to patient:Wife Can be reached:[phone] Pharmacy:  Reason for call:Rash on hair line at neck, requesting referral. [facility]. Pt does not need referral for insurance reasons. Wife will call and make appt

## 2015-06-18 DIAGNOSIS — F900 Attention-deficit hyperactivity disorder, predominantly inattentive type: Secondary | ICD-10-CM | POA: Diagnosis not present

## 2015-06-18 DIAGNOSIS — F3181 Bipolar II disorder: Secondary | ICD-10-CM | POA: Diagnosis not present

## 2015-06-18 DIAGNOSIS — F411 Generalized anxiety disorder: Secondary | ICD-10-CM | POA: Diagnosis not present

## 2015-06-24 MED FILL — BUPROPION HCL XL 150 MG TAB: 150 | 30 days supply | Qty: 90 | Fill #1

## 2015-06-24 MED FILL — lamoTRIgine 150 MG TABS: 150 | 30 days supply | Qty: 30 | Fill #1

## 2015-06-29 ENCOUNTER — Other Ambulatory Visit: Payer: Self-pay

## 2015-06-29 MED ORDER — ONDANSETRON HCL 8 MG PO TABS: 8.0000 mg | ORAL_TABLET | Freq: Three times a day (TID) | ORAL | Status: DC | PRN

## 2015-06-29 NOTE — Telephone Encounter (Signed)
Spoke to pt's spouse madison--Rx sent to [facility] per her request

## 2015-06-29 NOTE — Telephone Encounter (Signed)
Zofran rx'd--need to know what pharmacy since his usual ([facility]) is closed today.

## 2015-06-29 NOTE — Telephone Encounter (Signed)
Pt's spouse called [facility]---vomiting diarrhea x 3 hour---3 episodes of diarrhea since earlier this morning--pt denies fever--pt's spouse reports the stomach virus has been going through the house to other family members. Spouse Maddie wants zofran Rx sent to pharmacy pt--spoke to [facility] w/ [facility] and she states she advised pt would need to be seen and evaluated before such Rx can be sent in. Pt's spouse stated that she did not want to bring him in to [facility] due to his vomiting and diarrhea Sx--please advise

## 2015-07-01 ENCOUNTER — Telehealth: Payer: Self-pay | Admitting: *Deleted

## 2015-07-01 NOTE — Telephone Encounter (Signed)
[employer] note received via fax  Call:   Date: 3/265/17 Time: 1016   Caller: Jemere Sancho, spouse Return number: [phone]  Nurse: Michele Rockers, RN  Chief Complaint: Vomiting   Reason for call: Taaj has started vomiting and diarrhea within the last 3 hours. 3-4 episodes of vomiting and 3-4 episodes of diarrhea.   Related visit to physician within the last 2 weeks: No  Guideline: Vomiting; Mild or moderate vomiting and present > 48 hours  Disposition: See physician within 24 hours  **Per chart review, Zofran rx'd by Dr. Ricardo Jericho for pt symptoms. Called pt to follow up to see how he was feeling and LM to return call.**

## 2015-07-23 MED FILL — lamoTRIgine 150 MG TABS: 150 | 30 days supply | Qty: 30 | Fill #0

## 2015-07-23 MED FILL — BUPROPION HCL XL 150 MG TAB: 150 | 30 days supply | Qty: 90 | Fill #0

## 2015-08-05 ENCOUNTER — Other Ambulatory Visit (HOSPITAL_COMMUNITY): Payer: Self-pay | Admitting: Surgery

## 2015-08-09 DIAGNOSIS — I10 Essential (primary) hypertension: Secondary | ICD-10-CM | POA: Diagnosis not present

## 2015-08-09 DIAGNOSIS — F419 Anxiety disorder, unspecified: Secondary | ICD-10-CM | POA: Diagnosis not present

## 2015-08-15 ENCOUNTER — Ambulatory Visit (HOSPITAL_COMMUNITY)
Admission: RE | Admit: 2015-08-15 | Discharge: 2015-08-15 | Disposition: A | Discharge status: Home / Self Care | Payer: 59 | Source: Ambulatory Visit | Attending: Surgery | Admitting: Surgery

## 2015-08-15 ENCOUNTER — Other Ambulatory Visit: Payer: Self-pay

## 2015-08-15 DIAGNOSIS — Z01818 Encounter for other preprocedural examination: Secondary | ICD-10-CM | POA: Diagnosis not present

## 2015-08-23 MED FILL — BUPROPION HCL XL 150 MG TAB: 150 | 30 days supply | Qty: 90 | Fill #1

## 2015-08-23 MED FILL — lamoTRIgine 150 MG TABS: 150 | 30 days supply | Qty: 30 | Fill #1

## 2015-09-25 MED FILL — lamoTRIgine 150 MG TABS: 150 | 30 days supply | Qty: 30 | Fill #2

## 2015-09-25 MED FILL — BUPROPION HCL XL 150 MG TAB: 150 | 30 days supply | Qty: 90 | Fill #2

## 2015-10-25 MED FILL — lamoTRIgine 150 MG TABS: 150 | 30 days supply | Qty: 30 | Fill #0

## 2015-10-25 MED FILL — BUPROPION HCL XL 150 MG TAB: 150 | 30 days supply | Qty: 90 | Fill #0

## 2015-10-28 ENCOUNTER — Telehealth: Payer: Self-pay

## 2015-10-28 NOTE — Telephone Encounter (Signed)
Last appt: 05/07/14.  Pt needs an appt.  Left a message for call back.  When patient calls back, please schedule patient an appt.

## 2015-10-31 NOTE — Telephone Encounter (Signed)
Medical necessity form forwarded to Ronaldo Miyamoto, [place].

## 2015-10-31 NOTE — Telephone Encounter (Signed)
Called patient and scheduled appt with Dr. Etter Sjogren on 11/25/15 per his preference.

## 2015-11-25 ENCOUNTER — Ambulatory Visit: Payer: 59 | Admitting: Family Medicine

## 2015-11-25 DIAGNOSIS — Z0289 Encounter for other administrative examinations: Secondary | ICD-10-CM

## 2015-11-26 ENCOUNTER — Ambulatory Visit: Payer: 59 | Admitting: Skilled Nursing Facility1

## 2015-11-27 ENCOUNTER — Ambulatory Visit: Payer: 59 | Admitting: Skilled Nursing Facility1

## 2015-11-28 ENCOUNTER — Encounter: Payer: Self-pay | Admitting: Family Medicine

## 2015-12-04 DIAGNOSIS — F3181 Bipolar II disorder: Secondary | ICD-10-CM | POA: Diagnosis not present

## 2015-12-04 DIAGNOSIS — F900 Attention-deficit hyperactivity disorder, predominantly inattentive type: Secondary | ICD-10-CM | POA: Diagnosis not present

## 2015-12-04 DIAGNOSIS — F411 Generalized anxiety disorder: Secondary | ICD-10-CM | POA: Diagnosis not present

## 2015-12-04 MED FILL — lamoTRIgine 150 MG TABS: 150 | 30 days supply | Qty: 30 | Fill #0

## 2015-12-04 MED FILL — BUPROPION HCL XL 150 MG TAB: 150 | 30 days supply | Qty: 90 | Fill #0

## 2016-01-06 MED FILL — lamoTRIgine 150 MG TABS: 150 | 30 days supply | Qty: 30 | Fill #1

## 2016-01-06 MED FILL — BUPROPION HCL XL 150 MG TAB: 150 | 30 days supply | Qty: 90 | Fill #1

## 2016-01-28 ENCOUNTER — Encounter: Payer: Self-pay | Admitting: *Deleted

## 2016-01-28 ENCOUNTER — Emergency Department
Admission: EM | Admit: 2016-01-28 | Discharge: 2016-01-28 | Disposition: A | Discharge status: Home / Self Care | Payer: 59 | Source: Home / Self Care | Attending: Emergency Medicine | Admitting: Emergency Medicine

## 2016-01-28 DIAGNOSIS — F3181 Bipolar II disorder: Secondary | ICD-10-CM | POA: Diagnosis not present

## 2016-01-28 DIAGNOSIS — H60312 Diffuse otitis externa, left ear: Secondary | ICD-10-CM

## 2016-01-28 DIAGNOSIS — F900 Attention-deficit hyperactivity disorder, predominantly inattentive type: Secondary | ICD-10-CM | POA: Diagnosis not present

## 2016-01-28 DIAGNOSIS — F411 Generalized anxiety disorder: Secondary | ICD-10-CM | POA: Diagnosis not present

## 2016-01-28 HISTORY — DX: Major depressive disorder, single episode, unspecified: F32.9

## 2016-01-28 HISTORY — DX: Depression, unspecified: F32.A

## 2016-01-28 HISTORY — DX: Anxiety disorder, unspecified: F41.9

## 2016-01-28 MED ORDER — AMOXICILLIN-POT CLAVULANATE 875-125 MG PO TABS: 1.0000 | ORAL_TABLET | Freq: Two times a day (BID) | ORAL | 0 refills | Status: DC

## 2016-01-28 MED ORDER — CIPROFLOXACIN-HYDROCORTISONE 0.2-1 % OT SUSP: 3.0000 [drp] | Freq: Two times a day (BID) | OTIC | 0 refills | Status: DC

## 2016-01-28 MED FILL — CIPRODEX OTIC SUSPENSION: 0.3-0.1 | 18 days supply | Qty: 8 | Fill #0

## 2016-01-28 MED FILL — AMOX TR-K CLV 875-125 MG TA: 875-125 | 7 days supply | Qty: 14 | Fill #0

## 2016-01-28 NOTE — ED Triage Notes (Signed)
Pt c/o LT ear pain and swelling x 1 day, worse x 0300 today. Denies fever. He took Tylenol at 0330 today.

## 2016-01-30 NOTE — ED Provider Notes (Signed)
[facility]    CSN: [id number] Arrival date & time: 01/28/16  1301     History   Chief Complaint Chief Complaint  Patient presents with  . Otalgia    HPI Stephen Hartman is a 30 y.o. male.   HPI Pt c/o LT ear pain and swelling x 1 day, worse x 0300 today. May have had a low-grade fever.  He took Tylenol at 0330 today,And that didn't help much. He admits he uses Q-tips to clean his ears. Associated with the left ear pain, he notes decreased hearing and a pressure sensation left ear. Denies ear drainage. No bleeding. Denies coryza or sore throat. No fever or chills or nausea or vomiting or chest pain or cough. Past Medical History:  Diagnosis Date  . Anxiety   . Depression   . Low testosterone     Patient Active Problem List   Diagnosis Date Noted  . Tachycardia 01/03/2014  . Severe obesity (BMI >= 40) ([place]) 10/05/2013  . Elevated BP 03/24/2013  . ADD (attention deficit disorder) 01/04/2013  . Depression with anxiety 11/29/2011  . Hypogonadism, male 11/25/2011    Past Surgical History:  Procedure Laterality Date  . ROTATOR CUFF REPAIR Right   . TESTICLE SURGERY     childhood accident       Home Medications    Prior to Admission medications   Medication Sig Start Date End Date Taking? Authorizing Provider  buPROPion (WELLBUTRIN XL) 300 MG 24 hr tablet Take 450 mg by mouth daily.   Yes Historical Provider, MD  lamoTRIgine (LAMICTAL) 100 MG tablet Take 1 tablet (100 mg total) by mouth daily. 02/01/14  Yes Yvonne R Lowne Chase, DO  amoxicillin-clavulanate (AUGMENTIN) 875-125 MG tablet Take 1 tablet by mouth 2 (two) times daily. For 7 days. Take with food. 01/28/16   Jacqulyn Cane, MD  atomoxetine (STRATTERA) 80 MG capsule Take 1 capsule (80 mg total) by mouth daily. 05/07/14   Rosalita Chessman Chase, DO  ciprofloxacin-hydrocortisone (CIPRO [facility]) otic suspension Place 3 drops into the right ear 2 (two) times daily. For 7 days 01/28/16   Jacqulyn Cane,  MD    Family History Family History  Problem Relation Age of Onset  . Hypertension Father   . Hypertension Paternal Grandfather   . Stroke Maternal Grandfather     Social History Social History  Substance Use Topics  . Smoking status: Former Research scientist (life sciences)  . Smokeless tobacco: Never Used  . Alcohol use No     Allergies   Erythromycin   Review of Systems Review of Systems  All other systems reviewed and are negative.    Physical Exam Triage Vital Signs ED Triage Vitals  Enc Vitals Group     BP 01/28/16 1321 121/89     Pulse Rate 01/28/16 1321 82     Resp 01/28/16 1321 18     Temp 01/28/16 1321 98 F (36.7 C)     Temp Source 01/28/16 1321 Oral     SpO2 01/28/16 1321 98 %     Weight 01/28/16 1321 (!) 356 lb (161.5 kg)     Height 01/28/16 1321 5\' 10"  (1.778 m)     Head Circumference --      Peak Flow --      Pain Score 01/28/16 1326 7     Pain Loc --      Pain Edu? --      Excl. in [place]? --    No data found.  Updated Vital Signs BP 121/89 (BP Location: Left Arm)   Pulse 82   Temp 98 F (36.7 C) (Oral)   Resp 18   Ht 5\' 10"  (1.778 m)   Wt (!) 356 lb (161.5 kg)   SpO2 98%   BMI 51.08 kg/m    Physical Exam  Constitutional: He is oriented to person, place, and time. He appears well-developed and well-nourished. No distress.  HENT:  Head: Normocephalic and atraumatic.  Eyes: Pupils are equal, round, and reactive to light. No scleral icterus.  Neck: Normal range of motion. Neck supple.  Cardiovascular: Normal rate and regular rhythm.   Pulmonary/Chest: Effort normal.  Abdominal: He exhibits no distension.  Neurological: He is alert and oriented to person, place, and time. No cranial nerve deficit.  Skin: Skin is warm and dry.  Psychiatric: He has a normal mood and affect. His behavior is normal.  Vitals reviewed.  Right ear exam normal. Nontender. Canal normal. Left ear, very tender left external ear. Canal red and swollen and inflamed. No drainage.  Scant wax. Canal Patent. TM within normal limits.  UC Treatments / Results  Labs (all labs ordered are listed, but only abnormal results are displayed) Labs Reviewed - No data to display  EKG  EKG Interpretation None       Radiology No results found.  Procedures Procedures (including critical care time)  Medications Ordered in UC Medications - No data to display   Initial Impression / Assessment and Plan / UC Course  I have reviewed the triage vital signs and the nursing notes.  Pertinent labs & imaging results that were available during my care of the patient were reviewed by me and considered in my medical decision making (see chart for details).  Clinical Course      Final Clinical Impressions(s) / UC Diagnoses   Final diagnoses:  Acute diffuse otitis externa of left ear   Treatment options discussed, as well as risks, benefits, alternatives. I advised an antibiotic ear drop as well as an oral antibiotic because of severity of this otitis externa. Patient voiced understanding and agreement with the following plans: Ciprodex eardrops. Augmentin 875 twice a day 7 days Other advice given. Avoid Q-tips. Follow-up with your primary care doctor in 5-7 days or ENT if not improving, or sooner if symptoms become worse. Precautions discussed. Red flags discussed. Questions invited and answered. Patient voiced understanding and agreement.     Jacqulyn Cane, MD 01/30/16 [phone]

## 2016-02-04 DIAGNOSIS — L738 Other specified follicular disorders: Secondary | ICD-10-CM | POA: Diagnosis not present

## 2016-02-04 DIAGNOSIS — D225 Melanocytic nevi of trunk: Secondary | ICD-10-CM | POA: Diagnosis not present

## 2016-02-04 MED FILL — CLINDAMYCIN PH 1% SOLUTION: 1 | 30 days supply | Qty: 60 | Fill #0

## 2016-02-04 MED FILL — BUPROPION HCL XL 150 MG TAB: 150 | 30 days supply | Qty: 90 | Fill #0

## 2016-02-04 MED FILL — lamoTRIgine 150 MG TABS: 150 | 30 days supply | Qty: 30 | Fill #0

## 2016-02-26 ENCOUNTER — Emergency Department
Admission: EM | Admit: 2016-02-26 | Discharge: 2016-02-26 | Disposition: A | Discharge status: Home / Self Care | Payer: 59 | Source: Home / Self Care | Attending: Family Medicine | Admitting: Family Medicine

## 2016-02-26 ENCOUNTER — Encounter: Payer: Self-pay | Admitting: *Deleted

## 2016-02-26 DIAGNOSIS — M545 Low back pain, unspecified: Secondary | ICD-10-CM

## 2016-02-26 DIAGNOSIS — R109 Unspecified abdominal pain: Secondary | ICD-10-CM

## 2016-02-26 DIAGNOSIS — R51 Headache: Secondary | ICD-10-CM

## 2016-02-26 DIAGNOSIS — R519 Headache, unspecified: Secondary | ICD-10-CM

## 2016-02-26 LAB — POCT URINALYSIS DIP (MANUAL ENTRY)
Bilirubin, UA: NEGATIVE
Glucose, UA: NEGATIVE
Ketones, POC UA: NEGATIVE
Leukocytes, UA: NEGATIVE
Nitrite, UA: NEGATIVE
Protein Ur, POC: NEGATIVE
Spec Grav, UA: 1.02 (ref 1.005–1.03)
Urobilinogen, UA: 0.2 (ref 0–1)
pH, UA: 7 (ref 5–8)

## 2016-02-26 MED ORDER — CYCLOBENZAPRINE HCL 10 MG PO TABS: 10.0000 mg | ORAL_TABLET | Freq: Two times a day (BID) | ORAL | 0 refills | Status: DC | PRN

## 2016-02-26 MED ORDER — MELOXICAM 7.5 MG PO TABS: 15.0000 mg | ORAL_TABLET | Freq: Every day | ORAL | 0 refills | Status: DC

## 2016-02-26 MED FILL — MELOXICAM 7.5 MG TABLET: 7.5 | 23 days supply | Qty: 30 | Fill #0

## 2016-02-26 MED FILL — CYCLOBENZAPRINE 10 MG TAB: 10 | 10 days supply | Qty: 20 | Fill #0

## 2016-02-26 NOTE — Discharge Instructions (Signed)
°  Meloxicam (Mobic) is an antiinflammatory to help with pain and inflammation.  Do not take ibuprofen, Advil, Aleve, or any other medications that contain NSAIDs while taking meloxicam as this may cause stomach upset or even ulcers if taken in large amounts for an extended period of time.   Flexeril is a muscle relaxer and may cause drowsiness. Do not drink alcohol, drive, or operate heavy machinery while taking.
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
## 2016-02-26 NOTE — ED Triage Notes (Signed)
Pt c/o RT side flank pain and Ha x 2 days. Denies injury, hematuria or dysuria. He has taken Tylenol without relief.

## 2016-03-05 MED FILL — lamoTRIgine 150 MG TABS: 150 | 30 days supply | Qty: 30 | Fill #1

## 2016-03-05 MED FILL — BUPROPION HCL XL 150 MG TAB: 150 | 30 days supply | Qty: 90 | Fill #1

## 2016-03-10 ENCOUNTER — Telehealth: Payer: Self-pay | Admitting: *Deleted

## 2016-03-10 MED ORDER — PREDNISONE 20 MG PO TABS: ORAL_TABLET | ORAL | 0 refills | Status: DC

## 2016-03-10 NOTE — Telephone Encounter (Signed)
Patient reports he was able to get in with his PCP in 2 days. Advised the Rx for prednisone was sent in.

## 2016-03-10 NOTE — Telephone Encounter (Signed)
Patient reports he is still having the persistent pain and and asking what he should do next. Per Erin's discharge instructions, he was to follow up with his PCP within 1 week if not improving. He will call his PCP but asked for something else for pain in the meantime.The Mobic and Flexeril are not providing much relief.

## 2016-03-10 NOTE — Telephone Encounter (Signed)
I have reviewed Stephen Hartman notes from recent visit. I can prescribe a short course of prednisone for pain relief, but I'm not comfortable with prescribing controlled drug for pain. He needs to contact PCP and see PCP within 3 days.

## 2016-03-11 ENCOUNTER — Ambulatory Visit (HOSPITAL_COMMUNITY)
Admission: EM | Admit: 2016-03-11 | Discharge: 2016-03-11 | Disposition: A | Discharge status: Home / Self Care | Payer: 59 | Attending: Emergency Medicine | Admitting: Emergency Medicine

## 2016-03-11 ENCOUNTER — Encounter (HOSPITAL_COMMUNITY): Payer: Self-pay | Admitting: Emergency Medicine

## 2016-03-11 DIAGNOSIS — R109 Unspecified abdominal pain: Secondary | ICD-10-CM | POA: Diagnosis not present

## 2016-03-11 DIAGNOSIS — R3129 Other microscopic hematuria: Secondary | ICD-10-CM | POA: Diagnosis not present

## 2016-03-11 LAB — POCT URINALYSIS DIP (DEVICE)
Bilirubin Urine: NEGATIVE
Glucose, UA: NEGATIVE mg/dL
Ketones, ur: NEGATIVE mg/dL
Leukocytes, UA: NEGATIVE
NITRITE: NEGATIVE
PH: 5.5 (ref 5.0–8.0)
PROTEIN: NEGATIVE mg/dL
Specific Gravity, Urine: 1.025 (ref 1.005–1.030)
UROBILINOGEN UA: 0.2 mg/dL (ref 0.0–1.0)

## 2016-03-11 MED ORDER — HYDROCODONE-ACETAMINOPHEN 5-325 MG PO TABS: 1.0000 | ORAL_TABLET | Freq: Four times a day (QID) | ORAL | 0 refills | Status: DC | PRN

## 2016-03-11 MED ORDER — TAMSULOSIN HCL 0.4 MG PO CAPS: 0.4000 mg | ORAL_CAPSULE | Freq: Every day | ORAL | 0 refills | Status: DC

## 2016-03-11 MED ORDER — PREDNISONE 50 MG PO TABS: ORAL_TABLET | ORAL | 0 refills | Status: DC

## 2016-03-11 MED FILL — TAMSULOSIN HCL 0.4 MG CAP: 0.4 | 14 days supply | Qty: 14 | Fill #0

## 2016-03-11 MED FILL — predniSONE 50 MG TABS: 50 | 5 days supply | Qty: 5 | Fill #0

## 2016-03-11 MED FILL — HYDROCODON-APAP 5-325: 5-325 | 3 days supply | Qty: 15 | Fill #0
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
## 2016-03-11 NOTE — Discharge Instructions (Signed)
This seems to be coming more from your SI joint. However, with persistent trace blood in the urine, a kidney stone is possible. Take prednisone daily for 5 days. This will help if it's from the SI joint. Take Flomax daily for 2 weeks. This will help if it's a kidney stone. You can take 2 extra strength Tylenol (1000mg ) with 4 ibuprofen tablets (800mg ) every 8 hours to help with pain during the day. Use the hydrocodone as needed for severe pain. Do not take this while you are at work. Follow-up with your primary care doctor as scheduled tomorrow. You may end up needing an imaging study to evaluate for kidney stone.

## 2016-03-11 NOTE — ED Triage Notes (Signed)
Seen 11/22 at [facility].  Prescribed antiinflammatory and muscle relaxer.  No relief with medications.  Patient had blood in urine at the [facility] location.  Pain always a 5, but escalates.  At least once a day pain escalates.  Denies urinary symptoms.  Pain is right lower back/flank.

## 2016-03-12 ENCOUNTER — Ambulatory Visit: Payer: 59 | Admitting: Medical

## 2016-03-16 DIAGNOSIS — M545 Low back pain: Secondary | ICD-10-CM | POA: Diagnosis not present

## 2016-04-07 MED FILL — BUPROPION HCL XL 150 MG TAB: 150 | 30 days supply | Qty: 90 | Fill #2

## 2016-04-07 MED FILL — lamoTRIgine 150 MG TABS: 150 | 30 days supply | Qty: 30 | Fill #2

## 2016-04-21 DIAGNOSIS — F3181 Bipolar II disorder: Secondary | ICD-10-CM | POA: Diagnosis not present

## 2016-04-21 DIAGNOSIS — F900 Attention-deficit hyperactivity disorder, predominantly inattentive type: Secondary | ICD-10-CM | POA: Diagnosis not present

## 2016-04-21 DIAGNOSIS — F411 Generalized anxiety disorder: Secondary | ICD-10-CM | POA: Diagnosis not present

## 2016-05-11 MED FILL — busPIRone HCL 15 MG TABS: 15 | 31 days supply | Qty: 90 | Fill #0

## 2016-05-11 MED FILL — lamoTRIgine 200 MG TABS: 200 | 30 days supply | Qty: 30 | Fill #0

## 2016-05-11 MED FILL — BUPROPION HCL XL 150 MG TAB: 150 | 30 days supply | Qty: 90 | Fill #0

## 2016-05-29 DIAGNOSIS — F329 Major depressive disorder, single episode, unspecified: Secondary | ICD-10-CM | POA: Diagnosis not present

## 2016-05-29 DIAGNOSIS — Z0000 Encounter for general adult medical examination without abnormal findings: Secondary | ICD-10-CM | POA: Diagnosis not present

## 2016-05-29 DIAGNOSIS — E669 Obesity, unspecified: Secondary | ICD-10-CM | POA: Diagnosis not present

## 2016-06-11 MED FILL — BUPROPION HCL XL 150 MG TAB: 150 | 30 days supply | Qty: 90 | Fill #1

## 2016-06-11 MED FILL — lamoTRIgine 200 MG TABS: 200 | 30 days supply | Qty: 30 | Fill #1

## 2016-06-26 DIAGNOSIS — F411 Generalized anxiety disorder: Secondary | ICD-10-CM | POA: Diagnosis not present

## 2016-06-26 DIAGNOSIS — F900 Attention-deficit hyperactivity disorder, predominantly inattentive type: Secondary | ICD-10-CM | POA: Diagnosis not present

## 2016-06-26 DIAGNOSIS — F3181 Bipolar II disorder: Secondary | ICD-10-CM | POA: Diagnosis not present

## 2016-06-29 MED FILL — LANSOPRAZOLE 30 MG CPDR: 30 | 30 days supply | Qty: 30 | Fill #0

## 2016-07-01 DIAGNOSIS — H5213 Myopia, bilateral: Secondary | ICD-10-CM | POA: Diagnosis not present

## 2016-07-13 MED FILL — BUPROPION XL 150 MG TAB: 150 | 30 days supply | Qty: 90 | Fill #0

## 2016-07-13 MED FILL — lamoTRIgine 200 MG TABS: 200 | 30 days supply | Qty: 30 | Fill #0

## 2016-07-29 MED FILL — LANSOPRAZOLE 30 MG CPDR: 30 | 30 days supply | Qty: 30 | Fill #1

## 2016-08-11 MED FILL — BUPROPION XL 150 MG TAB: 150 | 30 days supply | Qty: 90 | Fill #1

## 2016-08-11 MED FILL — lamoTRIgine 200 MG TABS: 200 | 30 days supply | Qty: 30 | Fill #1

## 2016-09-01 MED FILL — LANSOPRAZOLE 30 MG CPDR: 30 | 30 days supply | Qty: 30 | Fill #2

## 2016-09-10 MED FILL — BUPROPION XL 150 MG TAB: 150 | 30 days supply | Qty: 90 | Fill #2

## 2016-09-10 MED FILL — lamoTRIgine 200 MG TABS: 200 | 30 days supply | Qty: 30 | Fill #2

## 2016-10-06 MED FILL — lamoTRIgine 200 MG TABS: 200 | 30 days supply | Qty: 30 | Fill #3

## 2016-10-06 MED FILL — BUPROPION HCL XL 150 MG TAB: 150 | 30 days supply | Qty: 90 | Fill #3

## 2016-10-08 MED FILL — LANSOPRAZOLE 30 MG CPDR: 30 | 30 days supply | Qty: 30 | Fill #0

## 2016-10-19 MED FILL — CLINDAMYCIN PH 1% SOLUTION: 1 | 30 days supply | Qty: 60 | Fill #1

## 2016-11-06 MED FILL — buPROPion HCL ER (XL) 150 M: 150 | 30 days supply | Qty: 90 | Fill #0

## 2016-11-06 MED FILL — LANSOPRAZOLE 30 MG CAP: 30 | 30 days supply | Qty: 30 | Fill #1

## 2016-11-06 MED FILL — lamoTRIgine 200 MG TABS: 200 | 30 days supply | Qty: 30 | Fill #0

## 2016-12-14 MED FILL — LANSOPRAZOLE 30 MG CAP: 30 | 30 days supply | Qty: 30 | Fill #2

## 2016-12-15 MED FILL — buPROPion HCL ER (XL) 150 M: 150 | 30 days supply | Qty: 90 | Fill #0

## 2016-12-15 MED FILL — lamoTRIgine 200 MG TABS: 200 | 30 days supply | Qty: 30 | Fill #0

## 2016-12-31 ENCOUNTER — Emergency Department (HOSPITAL_BASED_OUTPATIENT_CLINIC_OR_DEPARTMENT_OTHER): Payer: No Typology Code available for payment source

## 2016-12-31 ENCOUNTER — Emergency Department (HOSPITAL_BASED_OUTPATIENT_CLINIC_OR_DEPARTMENT_OTHER)
Admission: EM | Admit: 2016-12-31 | Discharge: 2017-01-01 | Disposition: A | Discharge status: Home / Self Care | Payer: No Typology Code available for payment source | Attending: Emergency Medicine | Admitting: Emergency Medicine

## 2016-12-31 ENCOUNTER — Encounter (HOSPITAL_BASED_OUTPATIENT_CLINIC_OR_DEPARTMENT_OTHER): Payer: Self-pay | Admitting: *Deleted

## 2016-12-31 DIAGNOSIS — Z87891 Personal history of nicotine dependence: Secondary | ICD-10-CM | POA: Insufficient documentation

## 2016-12-31 DIAGNOSIS — M549 Dorsalgia, unspecified: Secondary | ICD-10-CM | POA: Diagnosis not present

## 2016-12-31 DIAGNOSIS — M545 Low back pain: Secondary | ICD-10-CM | POA: Diagnosis not present

## 2016-12-31 DIAGNOSIS — Z79899 Other long term (current) drug therapy: Secondary | ICD-10-CM | POA: Diagnosis not present

## 2016-12-31 DIAGNOSIS — M546 Pain in thoracic spine: Secondary | ICD-10-CM | POA: Diagnosis not present

## 2016-12-31 DIAGNOSIS — M542 Cervicalgia: Secondary | ICD-10-CM | POA: Insufficient documentation
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
## 2016-12-31 NOTE — ED Notes (Signed)
ED Provider at bedside. 

## 2017-01-01 MED ORDER — CYCLOBENZAPRINE HCL 10 MG PO TABS: 10.0000 mg | ORAL_TABLET | Freq: Two times a day (BID) | ORAL | 0 refills | Status: DC | PRN

## 2017-01-01 MED ORDER — ACETAMINOPHEN 500 MG PO TABS: 500.0000 mg | ORAL_TABLET | Freq: Four times a day (QID) | ORAL | 0 refills | Status: DC | PRN

## 2017-01-01 MED ORDER — IBUPROFEN 600 MG PO TABS: 600.0000 mg | ORAL_TABLET | Freq: Four times a day (QID) | ORAL | 0 refills | Status: DC | PRN
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
## 2017-01-01 NOTE — ED Notes (Signed)
ED Provider at bedside. 

## 2017-01-19 DIAGNOSIS — F900 Attention-deficit hyperactivity disorder, predominantly inattentive type: Secondary | ICD-10-CM | POA: Diagnosis not present

## 2017-01-19 DIAGNOSIS — F3181 Bipolar II disorder: Secondary | ICD-10-CM | POA: Diagnosis not present

## 2017-01-19 DIAGNOSIS — F411 Generalized anxiety disorder: Secondary | ICD-10-CM | POA: Diagnosis not present

## 2017-01-19 MED FILL — lamoTRIgine 200 MG TABS: 200 | 30 days supply | Qty: 30 | Fill #0

## 2017-01-21 MED FILL — LANSOPRAZOLE 30 MG CAP: 30 | 30 days supply | Qty: 30 | Fill #3

## 2017-01-21 MED FILL — BUPROPION HCL XL 150 MG TAB: 150 | 30 days supply | Qty: 90 | Fill #0

## 2017-01-22 ENCOUNTER — Telehealth: Payer: Self-pay | Admitting: *Deleted

## 2017-01-22 NOTE — Telephone Encounter (Signed)
Received request for Medical Records from [facility]; forwarded to [place] for email/[email or web address]/SLS 10/19

## 2017-02-18 MED FILL — LANSOPRAZOLE 30 MG CAP: 30 | 30 days supply | Qty: 30 | Fill #4

## 2017-02-26 MED FILL — lamoTRIgine 200 MG TABS: 200 | 30 days supply | Qty: 30 | Fill #1

## 2017-02-26 MED FILL — BUPROPION HCL XL 150 MG TAB: 150 | 30 days supply | Qty: 90 | Fill #1

## 2017-03-29 MED FILL — lamoTRIgine 200 MG TABS: 200 | 30 days supply | Qty: 30 | Fill #2

## 2017-03-29 MED FILL — LANSOPRAZOLE 30 MG CAP: 30 | 30 days supply | Qty: 30 | Fill #5

## 2017-03-29 MED FILL — buPROPion HCL ER (XL) 150 M: 150 | 30 days supply | Qty: 90 | Fill #2

## 2017-04-27 MED FILL — LANSOPRAZOLE 30 MG CAP: 30 | 30 days supply | Qty: 30 | Fill #6

## 2017-04-28 MED FILL — lamoTRIgine 200 MG TABS: 200 | 30 days supply | Qty: 30 | Fill #3

## 2017-04-28 MED FILL — buPROPion HCL ER (XL) 150 M: 150 | 30 days supply | Qty: 90 | Fill #3

## 2017-05-27 MED FILL — LANSOPRAZOLE 30 MG CAP: 30 | 30 days supply | Qty: 30 | Fill #7

## 2017-05-27 MED FILL — lamoTRIgine 200 MG TABS: 200 | 30 days supply | Qty: 30 | Fill #0

## 2017-05-27 MED FILL — buPROPion HCL ER (XL) 150 M: 150 | 30 days supply | Qty: 90 | Fill #0

## 2017-06-25 MED FILL — buPROPion HCL ER (XL) 150 M: 150 | 30 days supply | Qty: 90 | Fill #1

## 2017-06-25 MED FILL — LANSOPRAZOLE 30 MG CAP: 30 | 30 days supply | Qty: 30 | Fill #8

## 2017-06-25 MED FILL — lamoTRIgine 200 MG TABS: 200 | 30 days supply | Qty: 30 | Fill #1

## 2017-07-29 MED FILL — lamoTRIgine 200 MG TABS: 200 | 30 days supply | Qty: 30 | Fill #2

## 2017-07-29 MED FILL — buPROPion HCL ER (XL) 150 M: 150 | 30 days supply | Qty: 90 | Fill #2

## 2017-07-29 MED FILL — LANSOPRAZOLE 30 MG CAP: 30 | 30 days supply | Qty: 30 | Fill #9

## 2017-08-27 MED FILL — lamoTRIgine 200 MG TABS: 200 | 30 days supply | Qty: 30 | Fill #3

## 2017-08-27 MED FILL — buPROPion HCL ER (XL) 150 M: 150 | 30 days supply | Qty: 90 | Fill #3

## 2017-08-27 MED FILL — LANSOPRAZOLE 30 MG CAP: 30 | 30 days supply | Qty: 30 | Fill #10

## 2017-09-23 ENCOUNTER — Encounter (INDEPENDENT_AMBULATORY_CARE_PROVIDER_SITE_OTHER): Payer: Self-pay

## 2017-09-27 MED FILL — lamoTRIgine 200 MG TABS: 200 | 30 days supply | Qty: 30 | Fill #0

## 2017-09-27 MED FILL — buPROPion HCL ER (XL) 150 M: 150 | 30 days supply | Qty: 90 | Fill #0

## 2017-09-27 MED FILL — LANSOPRAZOLE 30 MG CAP: 30 | 30 days supply | Qty: 30 | Fill #11

## 2017-10-25 ENCOUNTER — Encounter (INDEPENDENT_AMBULATORY_CARE_PROVIDER_SITE_OTHER): Payer: Self-pay | Admitting: Family Medicine

## 2017-10-25 ENCOUNTER — Ambulatory Visit (INDEPENDENT_AMBULATORY_CARE_PROVIDER_SITE_OTHER): Payer: No Typology Code available for payment source | Admitting: Family Medicine

## 2017-10-25 VITALS — BP 134/87 | HR 56 | Temp 97.5°F | Ht 70.0 in | Wt 343.0 lb

## 2017-10-25 DIAGNOSIS — R5383 Other fatigue: Secondary | ICD-10-CM | POA: Diagnosis not present

## 2017-10-25 DIAGNOSIS — Z9189 Other specified personal risk factors, not elsewhere classified: Secondary | ICD-10-CM | POA: Diagnosis not present

## 2017-10-25 DIAGNOSIS — Z0289 Encounter for other administrative examinations: Secondary | ICD-10-CM

## 2017-10-25 DIAGNOSIS — R0602 Shortness of breath: Secondary | ICD-10-CM | POA: Diagnosis not present

## 2017-10-25 DIAGNOSIS — Z1331 Encounter for screening for depression: Secondary | ICD-10-CM | POA: Diagnosis not present

## 2017-10-25 DIAGNOSIS — Z6841 Body Mass Index (BMI) 40.0 and over, adult: Secondary | ICD-10-CM

## 2017-10-25 DIAGNOSIS — R7303 Prediabetes: Secondary | ICD-10-CM | POA: Diagnosis not present

## 2017-10-25 MED FILL — lamoTRIgine 200 MG TABS: 200 | 30 days supply | Qty: 30 | Fill #1

## 2017-10-25 MED FILL — LANSOPRAZOLE 30 MG CAP: 30 | 30 days supply | Qty: 30 | Fill #0

## 2017-10-25 MED FILL — buPROPion HCL ER (XL) 150 M: 150 | 30 days supply | Qty: 90 | Fill #1

## 2017-10-25 NOTE — Progress Notes (Signed)
.  Office: [phone]  /  Fax: [phone]   HPI:   Chief Complaint: Stephen Hartman (MR# [id number]) is a 32 y.o. male who presents on 10/25/2017 for obesity evaluation and treatment. Current BMI is Body mass index is 49.22 kg/m.Stephen Hartman has struggled with obesity for years and has been unsuccessful in either losing weight or maintaining long term weight loss. [name] heard about our clinic through a coworker. Stephen Hartman attended our information session and states he is currently in the action stage of change and ready to dedicate time achieving and maintaining a healthier weight.  Stephen Hartman states his family eats meals together he thinks his family will eat healthier with  him his desired weight loss is 118 lbs he has been heavy most of  his life he started gaining weight in 8th grade his heaviest weight ever was 368 lbs. he has significant food cravings issues  he snacks frequently in the evenings he is frequently drinking liquids with calories he frequently makes poor food choices he frequently eats larger portions than normal  he has binge eating behaviors he struggles with emotional eating    Fatigue Stephen Hartman feels his energy is lower than it should be. This has worsened with weight gain and has not worsened recently. Stephen Hartman admits to daytime somnolence and admits to waking up still tired. Patient is at risk for obstructive sleep apnea. Patent has a history of symptoms of daytime fatigue and morning fatigue. Patient generally gets 6 hours of sleep per night, and states they generally have restless sleep. Snoring is present. Apneic episodes are not present. Epworth Sleepiness Score is 6  EKG was ordered today and shows bradycardia with 1 area of rate variation.  Dyspnea on exertion Bladyn notes increasing shortness of breath with exercising and seems to be worsening over time with weight gain. He notes getting out of breath sooner with activity than he used to. This  has not gotten worse recently. EKG was ordered today and shows bradycardia with 1 area of rate variation. Jackston denies orthopnea.  Pre-Diabetes Stephen Hartman has a historical diagnosis of prediabetes and was informed this puts him at greater risk of developing diabetes. Stephen Hartman has a high carb intake at home. He is not taking metformin currently and is attempting to work on diet and exercise to decrease risk of diabetes. He denies nausea or hypoglycemia.  At risk for diabetes Stephen Hartman is at higher than average risk for developing diabetes due to his obesity and pre-diabetes. He currently denies polyuria or polydipsia.  Depression Screen Stephen Hartman's Food and Mood (modified PHQ-9) score was  Depression screen PHQ 2/9 10/25/2017  Decreased Interest 1  Down, Depressed, Hopeless 1  PHQ - 2 Score 2  Altered sleeping 3  Tired, decreased energy 3  Change in appetite 1  Feeling bad or failure about yourself  1  Trouble concentrating 2  Moving slowly or fidgety/restless 1  Suicidal thoughts 0  PHQ-9 Score 13  Difficult doing work/chores Very difficult    ALLERGIES: Allergies  Allergen Reactions  . Erythromycin Nausea And Vomiting    MEDICATIONS: Current Outpatient Medications on File Prior to Visit  Medication Sig Dispense Refill  . buPROPion (WELLBUTRIN XL) 150 MG 24 hr tablet Take 150 mg by mouth daily.    Stephen Hartman Kitchen lamoTRIgine (LAMICTAL) 100 MG tablet Take 1 tablet (100 mg total) by mouth daily. (Patient taking differently: Take 75 mg by mouth daily. ) 30 tablet 3   No current facility-administered medications on file prior to  visit.     PAST MEDICAL HISTORY: Past Medical History:  Diagnosis Date  . Anxiety   . Back pain   . Bipolar 1 disorder ([name])   . Depression   . High blood pressure   . Knee pain   . Low testosterone     PAST SURGICAL HISTORY: Past Surgical History:  Procedure Laterality Date  . ROTATOR CUFF REPAIR Right   . TESTICLE SURGERY     childhood accident  .  TONSILLECTOMY      SOCIAL HISTORY: Social History   Tobacco Use  . Smoking status: Former Smoker    Years: 6.00    Last attempt to quit: 2016    Years since quitting: 3.5  . Smokeless tobacco: Never Used  Substance Use Topics  . Alcohol use: No  . Drug use: No    FAMILY HISTORY: Family History  Problem Relation Age of Onset  . Hypertension Father   . Cancer Father   . Bipolar disorder Father   . Depression Father   . Obesity Father   . Hypertension Paternal Grandfather   . Stroke Maternal Grandfather     ROS: Review of Systems  Constitutional: Positive for malaise/fatigue.  Eyes:       Wear Glasses or Contacts  Respiratory: Positive for shortness of breath (on exertion).   Cardiovascular: Positive for chest pain (chest pain/discomfort). Negative for orthopnea.  Gastrointestinal: Negative for nausea.  Genitourinary: Negative for frequency.  Musculoskeletal: Positive for back pain.       Muscle or Joint Pain  Endo/Heme/Allergies: Negative for polydipsia.       Negative for hypoglycemia  Psychiatric/Behavioral: Positive for depression. The patient is nervous/anxious (nervousness) and has insomnia.        Stress    PHYSICAL EXAM: Blood pressure 134/87, pulse (!) 56, temperature (!) 97.5 F (36.4 C), temperature source Oral, height 5\' 10"  (1.778 m), weight (!) 343 lb (155.6 kg), SpO2 97 %. Body mass index is 49.22 kg/m. Physical Exam  Constitutional: He is oriented to person, place, and time. He appears well-developed and well-nourished.  HENT:  Head: Normocephalic and atraumatic.  Nose: Nose normal.  Eyes: EOM are normal. No scleral icterus.  Neck: Normal range of motion. Neck supple. No thyromegaly present.  Cardiovascular: Regular rhythm. Bradycardia present.  Pulmonary/Chest: Effort normal. No respiratory distress.  Abdominal: Soft. There is no tenderness.  + obesity  Musculoskeletal: Normal range of motion.  Range of Motion normal in all 4 extremities    Neurological: He is alert and oriented to person, place, and time. Coordination normal.  Skin: Skin is warm and dry.  Psychiatric: He has a normal mood and affect. His behavior is normal.  Vitals reviewed.   RECENT LABS AND TESTS: BMET    Component Value Date/Time   NA 138 06/12/2011 1334   K 3.8 06/12/2011 1334   CL 104 06/12/2011 1334   CO2 26 06/12/2011 1334   GLUCOSE 97 06/12/2011 1334   BUN 15 06/12/2011 1334   CREATININE 0.8 06/12/2011 1334   CALCIUM 9.1 06/12/2011 1334   No results found for: HGBA1C No results found for: INSULIN CBC    Component Value Date/Time   WBC 9.3 06/12/2011 1334   RBC 5.08 06/12/2011 1334   HGB 15.3 06/12/2011 1334   HCT 44.6 06/12/2011 1334   PLT 284.0 06/12/2011 1334   MCV 87.8 06/12/2011 1334   MCHC 34.2 06/12/2011 1334   RDW 12.4 06/12/2011 1334   LYMPHSABS 2.2 06/12/2011 1334  MONOABS 0.5 06/12/2011 1334   EOSABS 0.2 06/12/2011 1334   BASOSABS 0.0 06/12/2011 1334   Iron/TIBC/Ferritin/ %Sat No results found for: IRON, TIBC, FERRITIN, IRONPCTSAT Lipid Panel     Component Value Date/Time   CHOL 136 06/12/2011 1334   TRIG 108.0 06/12/2011 1334   HDL 31.40 (L) 06/12/2011 1334   CHOLHDL 4 06/12/2011 1334   VLDL 21.6 06/12/2011 1334   LDLCALC 83 06/12/2011 1334   Hepatic Function Panel     Component Value Date/Time   PROT 6.6 06/12/2011 1334   ALBUMIN 3.8 06/12/2011 1334   AST 25 06/12/2011 1334   ALT 32 06/12/2011 1334   ALKPHOS 49 06/12/2011 1334   BILITOT 0.4 06/12/2011 1334   BILIDIR 0.1 06/12/2011 1334      Component Value Date/Time   TSH 1.09 06/12/2011 1334   Vitamin D There are no recent labs results  ECG  shows NSR with a rate of 59 BPM INDIRECT CALORIMETER done today shows a VO2 of 264 and a REE of 1836. His calculated basal metabolic rate is 2703 thus his basal metabolic rate is worse than expected.    ASSESSMENT AND PLAN: Other fatigue - Plan: EKG 12-Lead, VITAMIN D 25 Hydroxy (Vit-D Deficiency,  Fractures), Vitamin B12, Folate, T3, T4, free, TSH  Shortness of breath on exertion  Prediabetes - Plan: Comprehensive metabolic panel, Insulin, random  Depression screening  At risk for diabetes mellitus  Class 3 severe obesity with serious comorbidity and body mass index (BMI) of 45.0 to 49.9 in adult, unspecified obesity type ([place])  PLAN:  Fatigue Kazimierz was informed that his fatigue may be related to obesity, depression or many other causes. Labs will be ordered, and in the meanwhile Ignatius has agreed to work on diet, exercise and weight loss to help with fatigue. Proper sleep hygiene was discussed including the need for 7-8 hours of quality sleep each night. A sleep study was not ordered based on symptoms and Epworth score. We will order indirect calorimetry today.  Dyspnea on exertion Tekoa's shortness of breath appears to be obesity related and exercise induced. He has agreed to work on weight loss and gradually increase exercise to treat his exercise induced shortness of breath. If Keshon follows our instructions and loses weight without improvement of his shortness of breath, we will plan to refer to pulmonology. We will order labs and indirect calorimetry today and will monitor this condition regularly. Kolbi agrees to this plan.  Pre-Diabetes Thatcher will work on weight loss, exercise, and decreasing simple carbohydrates in his diet to help decrease the risk of diabetes. He was informed that eating too many simple carbohydrates or too many calories at one sitting increases the likelihood of GI side effects. We will check insulin level today and Gavriel agreed to follow up with [name] as directed to monitor his progress.  Diabetes risk counseling Lavere was given extended (15 minutes) diabetes prevention counseling today. He is 32 y.o. male and has risk factors for diabetes including obesity and pre-diabetes. We discussed intensive lifestyle modifications today with an  emphasis on weight loss as well as increasing exercise and decreasing simple carbohydrates in his diet.  Depression Screen Jayshawn had a moderately positive depression screening. Depression is commonly associated with obesity and often results in emotional eating behaviors. We will monitor this closely and work on CBT to help improve the non-hunger eating patterns. Referral to Psychology may be required if no improvement is seen as he continues in our clinic.  Obesity Nickolai  is currently in the action stage of change and his goal is to continue with weight loss efforts He has agreed to follow the Category 3 plan plus [employer] at breakfast Wilho has been instructed to work up to a goal of 150 minutes of combined cardio and strengthening exercise per week for weight loss and overall health benefits. We discussed the following Behavioral Modification Strategies today: planning for success, increasing lean protein intake, increasing vegetables and work on meal planning and easy cooking plans  Irwin has agreed to follow up with our clinic in 2 weeks. He was informed of the importance of frequent follow up visits to maximize his success with intensive lifestyle modifications for his multiple health conditions. He was informed we would discuss his lab results at his next visit unless there is a critical issue that needs to be addressed sooner. Waldo agreed to keep his next visit at the agreed upon time to discuss these results.    OBESITY BEHAVIORAL INTERVENTION VISIT  Today's visit was # 1 out of 22.  Starting weight: 343 lbs Starting date: 10/25/17 Today's weight : 343 lbs  Today's date: 10/25/2017 Total lbs lost to date: 0    ASK: We discussed the diagnosis of obesity with Beryle Beams today and Quay agreed to give [name] permission to discuss obesity behavioral modification therapy today.  ASSESS: Graeme has the diagnosis of obesity and his BMI today is 49.22 Calahan is  in the action stage of change   ADVISE: Zoe was educated on the multiple health risks of obesity as well as the benefit of weight loss to improve his health. He was advised of the need for long term treatment and the importance of lifestyle modifications.  AGREE: Multiple dietary modification options and treatment options were discussed and  Caleb agreed to the above obesity treatment plan.   I, Doreene Nest, am acting as transcriptionist for Eber Jones, MD   I have reviewed the above documentation for accuracy and completeness, and I agree with the above. - Ilene Qua, MD

## 2017-10-26 LAB — COMPREHENSIVE METABOLIC PANEL
ALK PHOS: 58 IU/L (ref 39–117)
ALT: 31 IU/L (ref 0–44)
AST: 20 IU/L (ref 0–40)
Albumin/Globulin Ratio: 1.8 (ref 1.2–2.2)
Albumin: 4.4 g/dL (ref 3.5–5.5)
BILIRUBIN TOTAL: 0.2 mg/dL (ref 0.0–1.2)
BUN/Creatinine Ratio: 15 (ref 9–20)
BUN: 14 mg/dL (ref 6–20)
CHLORIDE: 105 mmol/L (ref 96–106)
CO2: 21 mmol/L (ref 20–29)
CREATININE: 0.92 mg/dL (ref 0.76–1.27)
Calcium: 9.3 mg/dL (ref 8.7–10.2)
GFR calc Af Amer: 128 mL/min/{1.73_m2} (ref >59)
GFR calc non Af Amer: 110 mL/min/{1.73_m2} (ref >59)
GLOBULIN, TOTAL: 2.5 g/dL (ref 1.5–4.5)
Glucose: 89 mg/dL (ref 65–99)
Potassium: 4.7 mmol/L (ref 3.5–5.2)
SODIUM: 140 mmol/L (ref 134–144)
Total Protein: 6.9 g/dL (ref 6.0–8.5)

## 2017-10-26 LAB — VITAMIN B12: Vitamin B-12: 655 pg/mL (ref 232–1245)

## 2017-10-26 LAB — INSULIN, RANDOM: INSULIN: 23 u[IU]/mL (ref 2.6–24.9)

## 2017-10-26 LAB — VITAMIN D 25 HYDROXY (VIT D DEFICIENCY, FRACTURES): VIT D 25 HYDROXY: 33.5 ng/mL (ref 30.0–100.0)

## 2017-10-26 LAB — T3: T3 TOTAL: 121 ng/dL (ref 71–180)

## 2017-10-26 LAB — T4, FREE: Free T4: 1.24 ng/dL (ref 0.82–1.77)

## 2017-10-26 LAB — TSH: TSH: 1.58 u[IU]/mL (ref 0.450–4.500)

## 2017-10-26 LAB — FOLATE: Folate: >20 ng/mL (ref >3.0)

## 2017-11-01 ENCOUNTER — Encounter (INDEPENDENT_AMBULATORY_CARE_PROVIDER_SITE_OTHER): Payer: Self-pay | Admitting: Family Medicine

## 2017-11-08 ENCOUNTER — Ambulatory Visit (INDEPENDENT_AMBULATORY_CARE_PROVIDER_SITE_OTHER): Payer: No Typology Code available for payment source | Admitting: Family Medicine

## 2017-11-08 VITALS — BP 134/83 | HR 89 | Temp 97.6°F | Ht 70.0 in | Wt 336.0 lb

## 2017-11-08 DIAGNOSIS — E8881 Metabolic syndrome: Secondary | ICD-10-CM

## 2017-11-08 DIAGNOSIS — Z9189 Other specified personal risk factors, not elsewhere classified: Secondary | ICD-10-CM | POA: Diagnosis not present

## 2017-11-08 DIAGNOSIS — Z6841 Body Mass Index (BMI) 40.0 and over, adult: Secondary | ICD-10-CM

## 2017-11-08 DIAGNOSIS — E559 Vitamin D deficiency, unspecified: Secondary | ICD-10-CM | POA: Diagnosis not present

## 2017-11-08 MED ORDER — VITAMIN D (ERGOCALCIFEROL) 1.25 MG (50000 UNIT) PO CAPS: 50000.0000 [IU] | ORAL_CAPSULE | ORAL | 0 refills | Status: DC

## 2017-11-09 LAB — HEMOGLOBIN A1C
Est. average glucose Bld gHb Est-mCnc: 111 mg/dL
Hgb A1c MFr Bld: 5.5 % (ref 4.8–5.6)

## 2017-11-09 NOTE — Progress Notes (Signed)
Office: [phone]  /  Fax: [phone]   HPI:   Chief Complaint: OBESITY Dinari is here to discuss his progress with his obesity treatment plan. He is on the Category 3 plan plus [employer] at breakfast and is following his eating plan approximately 60 to 65 % of the time. He states he is exercising 0 minutes 0 times per week. Iris has no hunger, especially at night. He is occasionally not eating all hi breakfast. Adison struggled with night time snacking. He is still eating lunch at work and that is where he isn't following the plan. His weight is (!) 336 lb (152.4 kg) today and has had a weight loss of 7 pounds over a period of 2 weeks since his last visit. He has lost 7 lbs since starting treatment with [name].  Vitamin D deficiency Phong has a diagnosis of vitamin D deficiency. Zavon is not on OTC vit D and he denies nausea, vomiting or muscle weakness.  Insulin Resistance Lakeith has a diagnosis of insulin resistance based on his elevated fasting insulin level >5. Although Ilhan's blood glucose readings are still under good control, insulin resistance puts him at greater risk of metabolic syndrome and diabetes. Shawne admits to cravings, especially at night. There is no Hgb A1c result on file. He is not taking metformin currently and continues to work on diet and exercise to decrease risk of diabetes.  At risk for diabetes Dellas is at higher than average risk for developing diabetes due to his obesity and insulin resistance. He currently denies polyuria or polydipsia.  ALLERGIES: Allergies  Allergen Reactions  . Erythromycin Nausea And Vomiting    MEDICATIONS: Current Outpatient Medications on File Prior to Visit  Medication Sig Dispense Refill  . buPROPion (WELLBUTRIN XL) 150 MG 24 hr tablet Take 150 mg by mouth daily.    [name] lamoTRIgine (LAMICTAL) 100 MG tablet Take 1 tablet (100 mg total) by mouth daily. (Patient taking differently: Take 75 mg by mouth  daily. ) 30 tablet 3   No current facility-administered medications on file prior to visit.     PAST MEDICAL HISTORY: Past Medical History:  Diagnosis Date  . Anxiety   . Back pain   . Bipolar 1 disorder ([place])   . Depression   . High blood pressure   . Knee pain   . Low testosterone     PAST SURGICAL HISTORY: Past Surgical History:  Procedure Laterality Date  . ROTATOR CUFF REPAIR Right   . TESTICLE SURGERY     childhood accident  . TONSILLECTOMY      SOCIAL HISTORY: Social History   Tobacco Use  . Smoking status: Former Smoker    Years: 6.00    Last attempt to quit: 2016    Years since quitting: 3.5  . Smokeless tobacco: Never Used  Substance Use Topics  . Alcohol use: No  . Drug use: No    FAMILY HISTORY: Family History  Problem Relation Age of Onset  . Hypertension Father   . Cancer Father   . Bipolar disorder Father   . Depression Father   . Obesity Father   . Hypertension Paternal Grandfather   . Stroke Maternal Grandfather     ROS: Review of Systems  Constitutional: Positive for weight loss.  Gastrointestinal: Negative for nausea and vomiting.  Genitourinary: Negative for frequency.  Musculoskeletal:       Negative for muscle weakness  Endo/Heme/Allergies: Negative for polydipsia.       Positive for cravings Negative  for polyphagia    PHYSICAL EXAM: Blood pressure 134/83, pulse 89, temperature 97.6 F (36.4 C), temperature source Oral, height 5\' 10"  (1.778 m), weight (!) 336 lb (152.4 kg), SpO2 96 %. Body mass index is 48.21 kg/m. Physical Exam  Constitutional: He is oriented to person, place, and time. He appears well-developed and well-nourished.  Cardiovascular: Normal rate.  Pulmonary/Chest: Effort normal.  Musculoskeletal: Normal range of motion.  Neurological: He is oriented to person, place, and time.  Skin: Skin is warm and dry.  Psychiatric: He has a normal mood and affect. His behavior is normal.  Vitals  reviewed.   RECENT LABS AND TESTS: BMET    Component Value Date/Time   NA 140 10/25/2017 1053   K 4.7 10/25/2017 1053   CL 105 10/25/2017 1053   CO2 21 10/25/2017 1053   GLUCOSE 89 10/25/2017 1053   GLUCOSE 97 06/12/2011 1334   BUN 14 10/25/2017 1053   CREATININE 0.92 10/25/2017 1053   CALCIUM 9.3 10/25/2017 1053   GFRNONAA 110 10/25/2017 1053   GFRAA 128 10/25/2017 1053   Lab Results  Component Value Date   HGBA1C 5.5 11/08/2017   Lab Results  Component Value Date   INSULIN 23.0 10/25/2017   CBC    Component Value Date/Time   WBC 9.3 06/12/2011 1334   RBC 5.08 06/12/2011 1334   HGB 15.3 06/12/2011 1334   HCT 44.6 06/12/2011 1334   PLT 284.0 06/12/2011 1334   MCV 87.8 06/12/2011 1334   MCHC 34.2 06/12/2011 1334   RDW 12.4 06/12/2011 1334   LYMPHSABS 2.2 06/12/2011 1334   MONOABS 0.5 06/12/2011 1334   EOSABS 0.2 06/12/2011 1334   BASOSABS 0.0 06/12/2011 1334   Iron/TIBC/Ferritin/ %Sat No results found for: IRON, TIBC, FERRITIN, IRONPCTSAT Lipid Panel     Component Value Date/Time   CHOL 136 06/12/2011 1334   TRIG 108.0 06/12/2011 1334   HDL 31.40 (L) 06/12/2011 1334   CHOLHDL 4 06/12/2011 1334   VLDL 21.6 06/12/2011 1334   LDLCALC 83 06/12/2011 1334   Hepatic Function Panel     Component Value Date/Time   PROT 6.9 10/25/2017 1053   ALBUMIN 4.4 10/25/2017 1053   AST 20 10/25/2017 1053   ALT 31 10/25/2017 1053   ALKPHOS 58 10/25/2017 1053   BILITOT 0.2 10/25/2017 1053   BILIDIR 0.1 06/12/2011 1334      Component Value Date/Time   TSH 1.580 10/25/2017 1053   TSH 1.09 06/12/2011 1334   Results for JAYMOND, WAAGE (MRN [id number]) as of 11/09/2017 10:08  Ref. Range 10/25/2017 10:53  Vitamin D, 25-Hydroxy Latest Ref Range: 30.0 - 100.0 ng/mL 33.5   ASSESSMENT AND PLAN: Vitamin D deficiency - Plan: Vitamin D, Ergocalciferol, (DRISDOL) 50000 units CAPS capsule  Insulin resistance - Plan: Hemoglobin A1c  At risk for diabetes mellitus  Class 3  severe obesity with serious comorbidity and body mass index (BMI) of 45.0 to 49.9 in adult, unspecified obesity type ([place])  PLAN:  Vitamin D Deficiency Kiran was informed that low vitamin D levels contributes to fatigue and are associated with obesity, breast, and colon cancer. He agrees to start prescription Vit D @50 ,000 IU every week #4 with no refills and will follow up for routine testing of vitamin D, at least 2-3 times per year. He was informed of the risk of over-replacement of vitamin D and agrees to not increase his dose unless he discusses this with [name] first. Delmas agrees to follow up as directed.  Insulin Resistance  Demetris will continue to work on weight loss, exercise, and decreasing simple carbohydrates in his diet to help decrease the risk of diabetes. He was informed that eating too many simple carbohydrates or too many calories at one sitting increases the likelihood of GI side effects. Ladislav agreed to follow up with [name] as directed to monitor his progress.  Diabetes risk counseling Jas was given extended (30 minutes) diabetes prevention counseling today. He is 32 y.o. male and has risk factors for diabetes including obesity and insulin resistance. We discussed intensive lifestyle modifications today with an emphasis on weight loss as well as increasing exercise and decreasing simple carbohydrates in his diet. We will check Hgb A1c today and will follow up at the next appointment.  Obesity Atreus is currently in the action stage of change. As such, his goal is to continue with weight loss efforts He has agreed to follow the Category 3 plan Dequon has been instructed to work up to a goal of 150 minutes of combined cardio and strengthening exercise per week for weight loss and overall health benefits. We discussed the following Behavioral Modification Strategies today: planning for success, increasing lean protein intake, increasing vegetables and work on meal  planning and easy cooking plans  Jerardo has agreed to follow up with our clinic in 2 weeks. He was informed of the importance of frequent follow up visits to maximize his success with intensive lifestyle modifications for his multiple health conditions.   OBESITY BEHAVIORAL INTERVENTION VISIT  Today's visit was # 2 out of 22.  Starting weight: 343 lbs Starting date: 10/25/17 Today's weight : 336 lbs  Today's date: 11/08/2017 Total lbs lost to date: 7    ASK: We discussed the diagnosis of obesity with Beryle Beams today and Stormy agreed to give [name] permission to discuss obesity behavioral modification therapy today.  ASSESS: Kanaan has the diagnosis of obesity and his BMI today is 48.21 Dajion is in the action stage of change   ADVISE: Garvis was educated on the multiple health risks of obesity as well as the benefit of weight loss to improve his health. He was advised of the need for long term treatment and the importance of lifestyle modifications.  AGREE: Multiple dietary modification options and treatment options were discussed and  Brayton agreed to the above obesity treatment plan.  I, Doreene Nest, am acting as transcriptionist for Eber Jones, MD  I have reviewed the above documentation for accuracy and completeness, and I agree with the above. - Ilene Qua, MD

## 2017-11-22 ENCOUNTER — Ambulatory Visit (INDEPENDENT_AMBULATORY_CARE_PROVIDER_SITE_OTHER): Payer: No Typology Code available for payment source | Admitting: Physician Assistant

## 2017-11-22 VITALS — BP 133/84 | HR 80 | Temp 97.9°F | Ht 70.0 in | Wt 334.0 lb

## 2017-11-22 DIAGNOSIS — Z6841 Body Mass Index (BMI) 40.0 and over, adult: Secondary | ICD-10-CM

## 2017-11-22 DIAGNOSIS — E559 Vitamin D deficiency, unspecified: Secondary | ICD-10-CM

## 2017-11-22 NOTE — Progress Notes (Signed)
Office: [phone]  /  Fax: [phone]   HPI:   Chief Complaint: OBESITY Stephen Hartman is here to discuss his progress with his obesity treatment plan. He is on the Category 3 plan and is following his eating plan approximately 80 % of the time. He states he is exercising 0 minutes 0 times per week. Stephen Hartman did well with weight loss. He reports that he is having trouble getting all of the meat in at dinner. He is motivated to continue with his weight loss.  His weight is (!) 334 lb (151.5 kg) today and has had a weight loss of 2 pounds over a period of 2 weeks since his last visit. He has lost 9 lbs since starting treatment with [name].  Vitamin D deficiency Stephen Hartman has a diagnosis of vitamin D deficiency. He is currently taking prescription vit D and denies nausea, vomiting or muscle weakness.  ALLERGIES: Allergies  Allergen Reactions  . Erythromycin Nausea And Vomiting    MEDICATIONS: Current Outpatient Medications on File Prior to Visit  Medication Sig Dispense Refill  . buPROPion (WELLBUTRIN XL) 150 MG 24 hr tablet Take 150 mg by mouth daily.    Stephen Hartman Kitchen lamoTRIgine (LAMICTAL) 100 MG tablet Take 1 tablet (100 mg total) by mouth daily. (Patient taking differently: Take 75 mg by mouth daily. ) 30 tablet 3  . Vitamin D, Ergocalciferol, (DRISDOL) 50000 units CAPS capsule Take 1 capsule (50,000 Units total) by mouth every 7 (seven) days. 4 capsule 0   No current facility-administered medications on file prior to visit.     PAST MEDICAL HISTORY: Past Medical History:  Diagnosis Date  . Anxiety   . Back pain   . Bipolar 1 disorder ([place])   . Depression   . High blood pressure   . Knee pain   . Low testosterone     PAST SURGICAL HISTORY: Past Surgical History:  Procedure Laterality Date  . ROTATOR CUFF REPAIR Right   . TESTICLE SURGERY     childhood accident  . TONSILLECTOMY      SOCIAL HISTORY: Social History   Tobacco Use  . Smoking status: Former Smoker    Years: 6.00      Last attempt to quit: 2016    Years since quitting: 3.6  . Smokeless tobacco: Never Used  Substance Use Topics  . Alcohol use: No  . Drug use: No    FAMILY HISTORY: Family History  Problem Relation Age of Onset  . Hypertension Father   . Cancer Father   . Bipolar disorder Father   . Depression Father   . Obesity Father   . Hypertension Paternal Grandfather   . Stroke Maternal Grandfather     ROS: Review of Systems  Constitutional: Positive for weight loss.  Gastrointestinal: Negative for nausea and vomiting.  Musculoskeletal:       Negative for muscle weakness    PHYSICAL EXAM: Blood pressure 133/84, pulse 80, temperature 97.9 F (36.6 C), temperature source Oral, height 5\' 10"  (1.778 m), weight (!) 334 lb (151.5 kg), SpO2 96 %. Body mass index is 47.92 kg/m. Physical Exam  Constitutional: He is oriented to person, place, and time. He appears well-developed and well-nourished.  Cardiovascular: Normal rate.  Pulmonary/Chest: Effort normal.  Musculoskeletal: Normal range of motion.  Neurological: He is oriented to person, place, and time.  Skin: Skin is warm and dry.  Psychiatric: He has a normal mood and affect. His behavior is normal.  Vitals reviewed.   RECENT LABS AND TESTS: BMET  Component Value Date/Time   NA 140 10/25/2017 1053   K 4.7 10/25/2017 1053   CL 105 10/25/2017 1053   CO2 21 10/25/2017 1053   GLUCOSE 89 10/25/2017 1053   GLUCOSE 97 06/12/2011 1334   BUN 14 10/25/2017 1053   CREATININE 0.92 10/25/2017 1053   CALCIUM 9.3 10/25/2017 1053   GFRNONAA 110 10/25/2017 1053   GFRAA 128 10/25/2017 1053   Lab Results  Component Value Date   HGBA1C 5.5 11/08/2017   Lab Results  Component Value Date   INSULIN 23.0 10/25/2017   CBC    Component Value Date/Time   WBC 9.3 06/12/2011 1334   RBC 5.08 06/12/2011 1334   HGB 15.3 06/12/2011 1334   HCT 44.6 06/12/2011 1334   PLT 284.0 06/12/2011 1334   MCV 87.8 06/12/2011 1334   MCHC 34.2  06/12/2011 1334   RDW 12.4 06/12/2011 1334   LYMPHSABS 2.2 06/12/2011 1334   MONOABS 0.5 06/12/2011 1334   EOSABS 0.2 06/12/2011 1334   BASOSABS 0.0 06/12/2011 1334   Iron/TIBC/Ferritin/ %Sat No results found for: IRON, TIBC, FERRITIN, IRONPCTSAT Lipid Panel     Component Value Date/Time   CHOL 136 06/12/2011 1334   TRIG 108.0 06/12/2011 1334   HDL 31.40 (L) 06/12/2011 1334   CHOLHDL 4 06/12/2011 1334   VLDL 21.6 06/12/2011 1334   LDLCALC 83 06/12/2011 1334   Hepatic Function Panel     Component Value Date/Time   PROT 6.9 10/25/2017 1053   ALBUMIN 4.4 10/25/2017 1053   AST 20 10/25/2017 1053   ALT 31 10/25/2017 1053   ALKPHOS 58 10/25/2017 1053   BILITOT 0.2 10/25/2017 1053   BILIDIR 0.1 06/12/2011 1334      Component Value Date/Time   TSH 1.580 10/25/2017 1053   TSH 1.09 06/12/2011 1334   Results for Stephen, Hartman (MRN [id number]) as of 11/22/2017 17:43  Ref. Range 10/25/2017 10:53  Vitamin D, 25-Hydroxy Latest Ref Range: 30.0 - 100.0 ng/mL 33.5   ASSESSMENT AND PLAN: Vitamin D deficiency  Class 3 severe obesity with serious comorbidity and body mass index (BMI) of 45.0 to 49.9 in adult, unspecified obesity type ([place])  PLAN:  Vitamin D Deficiency Stephen Hartman was informed that low vitamin D levels contributes to fatigue and are associated with obesity, breast, and colon cancer. He agrees to continue to take prescription Vit D @50 ,000 IU every week and will follow up for routine testing of vitamin D, at least 2-3 times per year. He was informed of the risk of over-replacement of vitamin D and agrees to not increase his dose unless he discusses this with [name] first.  We spent > than 50% of the 15 minute visit on the counseling as documented in the note.  Obesity Stephen Hartman is currently in the action stage of change. As such, his goal is to continue with weight loss efforts He has agreed to follow the Category 3 plan Stephen Hartman has been instructed to work up to a goal of  150 minutes of combined cardio and strengthening exercise per week for weight loss and overall health benefits. We discussed the following Behavioral Modification Strategies today: work on meal planning and easy cooking plans and ways to avoid boredom eating  Stephen Hartman has agreed to follow up with our clinic in 3 weeks. He was informed of the importance of frequent follow up visits to maximize his success with intensive lifestyle modifications for his multiple health conditions.   OBESITY BEHAVIORAL INTERVENTION VISIT  Today's visit was # 3  out of 22.  Starting weight: 343 lbs Starting date: 10/25/17 Today's weight : 334 lbs Today's date: 11/22/2017 Total lbs lost to date: 9    ASK: We discussed the diagnosis of obesity with Stephen Hartman today and Dow agreed to give [name] permission to discuss obesity behavioral modification therapy today.  ASSESS: Stephen Hartman has the diagnosis of obesity and his BMI today is 47.92 Stephen Hartman is in the action stage of change   ADVISE: Stephen Hartman was educated on the multiple health risks of obesity as well as the benefit of weight loss to improve his health. He was advised of the need for long term treatment and the importance of lifestyle modifications.  AGREE: Multiple dietary modification options and treatment options were discussed and  Stephen Hartman agreed to the above obesity treatment plan.  Corey Skains, am acting as transcriptionist for Abby Potash, PA-C I, Abby Potash, PA-C have reviewed above note and agree with its content

## 2017-11-29 MED FILL — LANSOPRAZOLE 30 MG CPDR: 30 | 30 days supply | Qty: 30 | Fill #1

## 2017-11-29 MED FILL — VIT D2 1.25 MG (50,000 UNIT: 1.25 MG | 28 days supply | Qty: 4 | Fill #0

## 2017-11-29 MED FILL — lamoTRIgine 200 MG TABS: 200 | 30 days supply | Qty: 30 | Fill #2

## 2017-11-29 MED FILL — buPROPion HCL ER (XL) 150 M: 150 | 30 days supply | Qty: 90 | Fill #2

## 2017-12-14 ENCOUNTER — Ambulatory Visit (INDEPENDENT_AMBULATORY_CARE_PROVIDER_SITE_OTHER): Payer: No Typology Code available for payment source | Admitting: Physician Assistant

## 2017-12-14 VITALS — BP 106/72 | HR 86 | Temp 97.6°F | Ht 70.0 in | Wt 331.0 lb

## 2017-12-14 DIAGNOSIS — E559 Vitamin D deficiency, unspecified: Secondary | ICD-10-CM

## 2017-12-14 DIAGNOSIS — Z9189 Other specified personal risk factors, not elsewhere classified: Secondary | ICD-10-CM | POA: Diagnosis not present

## 2017-12-14 DIAGNOSIS — E8881 Metabolic syndrome: Secondary | ICD-10-CM | POA: Diagnosis not present

## 2017-12-14 DIAGNOSIS — Z6841 Body Mass Index (BMI) 40.0 and over, adult: Secondary | ICD-10-CM

## 2017-12-14 MED ORDER — VITAMIN D (ERGOCALCIFEROL) 1.25 MG (50000 UNIT) PO CAPS: 50000.0000 [IU] | ORAL_CAPSULE | ORAL | 0 refills | Status: DC

## 2017-12-14 MED ORDER — METFORMIN HCL 500 MG PO TABS: 500.0000 mg | ORAL_TABLET | Freq: Every day | ORAL | 0 refills | Status: DC

## 2017-12-14 MED FILL — metFORMIN HCL 500 MG TABS: 500 | 30 days supply | Qty: 30 | Fill #0

## 2017-12-15 NOTE — Progress Notes (Signed)
Office: [phone]  /  Fax: [phone]   HPI:   Chief Complaint: OBESITY Stephen Hartman is here to discuss his progress with his obesity treatment plan. He is on the Category 3 plan and is following his eating plan approximately 70 % of the time. He states he is exercising 0 minutes 0 times per week. [name] did well with weight loss. He reports that he is not following the plan closely and he is eating out once or twice weekly. He is also reporting polyphagia.  His weight is (!) 331 lb (150.1 kg) today and has had a weight loss of 3 pounds over a period of 3 weeks since his last visit. He has lost 12 lbs since starting treatment with [name].  Vitamin D Deficiency Stephen Hartman has a diagnosis of vitamin D deficiency. He is on prescription Vit D and denies nausea, vomiting or muscle weakness.  At risk for osteopenia and osteoporosis Stephen Hartman is at higher risk of osteopenia and osteoporosis due to vitamin D deficiency.   Insulin Resistance Stephen Hartman has a diagnosis of insulin resistance based on his elevated fasting insulin level >5. Although [name]'s blood glucose readings are still under good control, insulin resistance puts him at greater risk of metabolic syndrome and diabetes. He reports polyphagia and will start metformin due to excessive hunger. He continues to work on diet and exercise to decrease risk of diabetes.  ALLERGIES: Allergies  Allergen Reactions  . Erythromycin Nausea And Vomiting    MEDICATIONS: Current Outpatient Medications on File Prior to Visit  Medication Sig Dispense Refill  . buPROPion (WELLBUTRIN XL) 150 MG 24 hr tablet Take 150 mg by mouth daily.    [name] lamoTRIgine (LAMICTAL) 100 MG tablet Take 1 tablet (100 mg total) by mouth daily. (Patient taking differently: Take 75 mg by mouth daily. ) 30 tablet 3   No current facility-administered medications on file prior to visit.     PAST MEDICAL HISTORY: Past Medical History:  Diagnosis Date  . Anxiety   . Back  pain   . Bipolar 1 disorder ([place])   . Depression   . High blood pressure   . Knee pain   . Low testosterone     PAST SURGICAL HISTORY: Past Surgical History:  Procedure Laterality Date  . ROTATOR CUFF REPAIR Right   . TESTICLE SURGERY     childhood accident  . TONSILLECTOMY      SOCIAL HISTORY: Social History   Tobacco Use  . Smoking status: Former Smoker    Years: 6.00    Last attempt to quit: 2016    Years since quitting: 3.6  . Smokeless tobacco: Never Used  Substance Use Topics  . Alcohol use: No  . Drug use: No    FAMILY HISTORY: Family History  Problem Relation Age of Onset  . Hypertension Father   . Cancer Father   . Bipolar disorder Father   . Depression Father   . Obesity Father   . Hypertension Paternal Grandfather   . Stroke Maternal Grandfather     ROS: Review of Systems  Constitutional: Positive for weight loss.  Gastrointestinal: Negative for nausea and vomiting.  Musculoskeletal:       Negative muscle weakness  Endo/Heme/Allergies:       Positive polyphagia    PHYSICAL EXAM: Blood pressure 106/72, pulse 86, temperature 97.6 F (36.4 C), temperature source Oral, height 5\' 10"  (1.778 m), weight (!) 331 lb (150.1 kg), SpO2 97 %. Body mass index is 47.49 kg/m. Physical Exam  Constitutional: He is oriented to person, place, and time. He appears well-developed and well-nourished.  Cardiovascular: Normal rate.  Pulmonary/Chest: Effort normal.  Musculoskeletal: Normal range of motion.  Neurological: He is oriented to person, place, and time.  Skin: Skin is warm and dry.  Psychiatric: He has a normal mood and affect. His behavior is normal.  Vitals reviewed.   RECENT LABS AND TESTS: BMET    Component Value Date/Time   NA 140 10/25/2017 1053   K 4.7 10/25/2017 1053   CL 105 10/25/2017 1053   CO2 21 10/25/2017 1053   GLUCOSE 89 10/25/2017 1053   GLUCOSE 97 06/12/2011 1334   BUN 14 10/25/2017 1053   CREATININE 0.92 10/25/2017 1053    CALCIUM 9.3 10/25/2017 1053   GFRNONAA 110 10/25/2017 1053   GFRAA 128 10/25/2017 1053   Lab Results  Component Value Date   HGBA1C 5.5 11/08/2017   Lab Results  Component Value Date   INSULIN 23.0 10/25/2017   CBC    Component Value Date/Time   WBC 9.3 06/12/2011 1334   RBC 5.08 06/12/2011 1334   HGB 15.3 06/12/2011 1334   HCT 44.6 06/12/2011 1334   PLT 284.0 06/12/2011 1334   MCV 87.8 06/12/2011 1334   MCHC 34.2 06/12/2011 1334   RDW 12.4 06/12/2011 1334   LYMPHSABS 2.2 06/12/2011 1334   MONOABS 0.5 06/12/2011 1334   EOSABS 0.2 06/12/2011 1334   BASOSABS 0.0 06/12/2011 1334   Iron/TIBC/Ferritin/ %Sat No results found for: IRON, TIBC, FERRITIN, IRONPCTSAT Lipid Panel     Component Value Date/Time   CHOL 136 06/12/2011 1334   TRIG 108.0 06/12/2011 1334   HDL 31.40 (L) 06/12/2011 1334   CHOLHDL 4 06/12/2011 1334   VLDL 21.6 06/12/2011 1334   LDLCALC 83 06/12/2011 1334   Hepatic Function Panel     Component Value Date/Time   PROT 6.9 10/25/2017 1053   ALBUMIN 4.4 10/25/2017 1053   AST 20 10/25/2017 1053   ALT 31 10/25/2017 1053   ALKPHOS 58 10/25/2017 1053   BILITOT 0.2 10/25/2017 1053   BILIDIR 0.1 06/12/2011 1334      Component Value Date/Time   TSH 1.580 10/25/2017 1053   TSH 1.09 06/12/2011 1334  Results for BENNET, KUJAWA (MRN [id number]) as of 12/15/2017 09:14  Ref. Range 10/25/2017 10:53  Vitamin D, 25-Hydroxy Latest Ref Range: 30.0 - 100.0 ng/mL 33.5    ASSESSMENT AND PLAN: Vitamin D deficiency - Plan: Vitamin D, Ergocalciferol, (DRISDOL) 50000 units CAPS capsule  Insulin resistance - Plan: metFORMIN (GLUCOPHAGE) 500 MG tablet  At risk for osteoporosis  Class 3 severe obesity with serious comorbidity and body mass index (BMI) of 45.0 to 49.9 in adult, unspecified obesity type ([place])  PLAN:  Vitamin D Deficiency Stephen Hartman was informed that low vitamin D levels contributes to fatigue and are associated with obesity, breast, and colon cancer.  Stephen Hartman agrees to continue taking prescription Vit D @50 ,000 IU every week #4 and we will refill for 1 month. He will follow up for routine testing of vitamin D, at least 2-3 times per year. He was informed of the risk of over-replacement of vitamin D and agrees to not increase his dose unless he discusses this with [name] first. Sabas agrees to follow up with our clinic in 2 weeks.  At risk for osteopenia and osteoporosis Winford was given extended  (15 minutes) osteoporosis prevention counseling today. Gavriel is at risk for osteopenia and osteoporsis due to his vitamin D deficiency. He was encouraged  to take his vitamin D and follow his higher calcium diet and increase strengthening exercise to help strengthen his bones and decrease his risk of osteopenia and osteoporosis.  Insulin Resistance Kamari will continue to work on weight loss, diet, exercise, and decreasing simple carbohydrates in his diet to help decrease the risk of diabetes. We dicussed metformin including benefits and risks. He was informed that eating too many simple carbohydrates or too many calories at one sitting increases the likelihood of GI side effects. Loys agrees to start metformin 500 mg 1 tablet PO qd at 12 PM with no refills. Quindell agrees to follow up with our clinic in 2 weeks as directed to monitor his progress.  Obesity Raymund is currently in the action stage of change. As such, his goal is to continue with weight loss efforts He has agreed to follow the Category 3 plan Godfrey has been instructed to work up to a goal of 150 minutes of combined cardio and strengthening exercise per week for weight loss and overall health benefits. We discussed the following Behavioral Modification Strategies today: work on meal planning and easy cooking plans and better snacking choices   Clayden has agreed to follow up with our clinic in 2 weeks. He was informed of the importance of frequent follow up visits to  maximize his success with intensive lifestyle modifications for his multiple health conditions.   OBESITY BEHAVIORAL INTERVENTION VISIT  Today's visit was # 4   Starting weight: 343 lbs Starting date: 10/25/17 Today's weight : 331 lbs  Today's date: 12/14/2017 Total lbs lost to date: 12    ASK: We discussed the diagnosis of obesity with Beryle Beams today and D[place] agreed to give [name] permission to discuss obesity behavioral modification therapy today.  ASSESS: Anjel has the diagnosis of obesity and his BMI today is 37.49 Tarick is in the action stage of change   ADVISE: Deshannon was educated on the multiple health risks of obesity as well as the benefit of weight loss to improve his health. He was advised of the need for long term treatment and the importance of lifestyle modifications.  AGREE: Multiple dietary modification options and treatment options were discussed and  Kodey agreed to the above obesity treatment plan.  Wilhemena Durie, am acting as transcriptionist for Abby Potash, PA-C I, Abby Potash, PA-C have reviewed above note and agree with its content

## 2017-12-27 ENCOUNTER — Encounter (INDEPENDENT_AMBULATORY_CARE_PROVIDER_SITE_OTHER): Payer: Self-pay | Admitting: Family Medicine

## 2017-12-27 MED FILL — lamoTRIgine 200 MG TABS: 200 | 30 days supply | Qty: 30 | Fill #3

## 2017-12-27 MED FILL — buPROPion HCL ER (XL) 150 M: 150 | 30 days supply | Qty: 90 | Fill #3

## 2017-12-27 MED FILL — VIT D2 1.25 MG (50,000 UNIT: 1.25 MG | 28 days supply | Qty: 4 | Fill #0

## 2017-12-27 MED FILL — LANSOPRAZOLE 30 MG CPDR: 30 | 30 days supply | Qty: 30 | Fill #2

## 2017-12-30 ENCOUNTER — Ambulatory Visit (INDEPENDENT_AMBULATORY_CARE_PROVIDER_SITE_OTHER): Payer: No Typology Code available for payment source | Admitting: Physician Assistant

## 2018-01-10 ENCOUNTER — Ambulatory Visit (INDEPENDENT_AMBULATORY_CARE_PROVIDER_SITE_OTHER): Payer: No Typology Code available for payment source | Admitting: Physician Assistant

## 2018-01-10 ENCOUNTER — Encounter (INDEPENDENT_AMBULATORY_CARE_PROVIDER_SITE_OTHER): Payer: Self-pay

## 2018-01-26 MED FILL — buPROPion HCL ER (XL) 150 M: 150 | 30 days supply | Qty: 90 | Fill #0

## 2018-01-26 MED FILL — SUBVENITE 200 MG TABS: 200 | 30 days supply | Qty: 30 | Fill #0

## 2018-01-26 MED FILL — LANSOPRAZOLE 30 MG CPDR: 30 | 30 days supply | Qty: 30 | Fill #3

## 2018-02-14 MED FILL — CLINDAMYCIN PH 1% SOLUTION: 1 | 10 days supply | Qty: 30 | Fill #0

## 2018-02-25 MED FILL — buPROPion HCL ER (XL) 150 M: 150 | 30 days supply | Qty: 90 | Fill #1

## 2018-02-25 MED FILL — SUBVENITE 200 MG TABS: 200 | 30 days supply | Qty: 30 | Fill #1

## 2018-02-25 MED FILL — LANSOPRAZOLE 30 MG CPDR: 30 | 30 days supply | Qty: 30 | Fill #4

## 2018-03-28 MED FILL — LANSOPRAZOLE 30 MG CPDR: 30 | 30 days supply | Qty: 30 | Fill #5

## 2018-03-28 MED FILL — buPROPion HCL ER (XL) 150 M: 150 | 30 days supply | Qty: 90 | Fill #2

## 2018-03-28 MED FILL — lamoTRIgine 200 MG TABS: 200 | 30 days supply | Qty: 30 | Fill #2

## 2018-04-26 MED FILL — LANSOPRAZOLE 30 MG CPDR: 30 | 30 days supply | Qty: 30 | Fill #6

## 2018-04-26 MED FILL — lamoTRIgine 200 MG TABS: 200 | 30 days supply | Qty: 30 | Fill #0

## 2018-04-26 MED FILL — buPROPion HCL ER (XL) 150 M: 150 | 30 days supply | Qty: 90 | Fill #0

## 2018-05-23 DIAGNOSIS — F900 Attention-deficit hyperactivity disorder, predominantly inattentive type: Secondary | ICD-10-CM | POA: Diagnosis not present

## 2018-05-23 DIAGNOSIS — F3181 Bipolar II disorder: Secondary | ICD-10-CM | POA: Diagnosis not present

## 2018-05-30 MED FILL — PRAMIPEXOLE 0.25 MG TABLET: 0.25 | 41 days supply | Qty: 90 | Fill #0

## 2018-05-30 MED FILL — lamoTRIgine 200 MG TABS: 200 | 30 days supply | Qty: 30 | Fill #0

## 2018-05-30 MED FILL — buPROPion HCL ER (XL) 150 M: 150 | 30 days supply | Qty: 90 | Fill #0

## 2018-05-30 MED FILL — LANSOPRAZOLE 30 MG CPDR: 30 | 30 days supply | Qty: 30 | Fill #7

## 2018-06-29 MED FILL — buPROPion HCL ER (XL) 150 M: 150 | 30 days supply | Qty: 90 | Fill #1

## 2018-06-29 MED FILL — LANSOPRAZOLE 30 MG CPDR: 30 | 30 days supply | Qty: 30 | Fill #8

## 2018-06-29 MED FILL — SUBVENITE 200 MG TABS: 200 | 30 days supply | Qty: 30 | Fill #1

## 2018-07-28 MED FILL — LANSOPRAZOLE 30 MG CPDR: 30 | 30 days supply | Qty: 30 | Fill #9

## 2018-07-30 MED FILL — buPROPion HCL ER (XL) 150 M: 150 | 30 days supply | Qty: 90 | Fill #0

## 2018-07-30 MED FILL — SUBVENITE 200 MG TABS: 200 | 30 days supply | Qty: 30 | Fill #0

## 2018-07-30 MED FILL — PRAMIPEXOLE 0.5 MG TABLET: 0.5 | 30 days supply | Qty: 90 | Fill #0

## 2018-08-03 DIAGNOSIS — F329 Major depressive disorder, single episode, unspecified: Secondary | ICD-10-CM | POA: Diagnosis not present

## 2018-08-03 DIAGNOSIS — K529 Noninfective gastroenteritis and colitis, unspecified: Secondary | ICD-10-CM | POA: Diagnosis not present

## 2018-08-04 DIAGNOSIS — F3340 Major depressive disorder, recurrent, in remission, unspecified: Secondary | ICD-10-CM | POA: Diagnosis not present

## 2018-09-02 MED FILL — SUBVENITE 200 MG TABS: 200 | 30 days supply | Qty: 30 | Fill #0

## 2018-09-02 MED FILL — LANSOPRAZOLE 30 MG CPDR: 30 | 30 days supply | Qty: 30 | Fill #10

## 2018-09-02 MED FILL — PRAMIPEXOLE 0.5 MG TABLET: 0.5 | 30 days supply | Qty: 90 | Fill #0

## 2018-09-02 MED FILL — buPROPion HCL ER (XL) 150 M: 150 | 30 days supply | Qty: 90 | Fill #0

## 2018-09-07 ENCOUNTER — Emergency Department (HOSPITAL_COMMUNITY)
Admission: EM | Admit: 2018-09-07 | Discharge: 2018-09-07 | Disposition: A | Discharge status: Home / Self Care | Payer: PRIVATE HEALTH INSURANCE | Attending: Emergency Medicine | Admitting: Emergency Medicine

## 2018-09-07 ENCOUNTER — Encounter (HOSPITAL_COMMUNITY): Payer: Self-pay

## 2018-09-07 DIAGNOSIS — Z79899 Other long term (current) drug therapy: Secondary | ICD-10-CM | POA: Diagnosis not present

## 2018-09-07 DIAGNOSIS — S41152A Open bite of left upper arm, initial encounter: Secondary | ICD-10-CM | POA: Diagnosis not present

## 2018-09-07 DIAGNOSIS — Z87891 Personal history of nicotine dependence: Secondary | ICD-10-CM | POA: Insufficient documentation

## 2018-09-07 DIAGNOSIS — Y9389 Activity, other specified: Secondary | ICD-10-CM | POA: Insufficient documentation

## 2018-09-07 DIAGNOSIS — Z7984 Long term (current) use of oral hypoglycemic drugs: Secondary | ICD-10-CM | POA: Insufficient documentation

## 2018-09-07 DIAGNOSIS — Y990 Civilian activity done for income or pay: Secondary | ICD-10-CM | POA: Diagnosis not present

## 2018-09-07 DIAGNOSIS — W503XXA Accidental bite by another person, initial encounter: Secondary | ICD-10-CM

## 2018-09-07 DIAGNOSIS — S4992XA Unspecified injury of left shoulder and upper arm, initial encounter: Secondary | ICD-10-CM | POA: Diagnosis present

## 2018-09-07 DIAGNOSIS — Y9289 Other specified places as the place of occurrence of the external cause: Secondary | ICD-10-CM | POA: Insufficient documentation

## 2018-09-07 LAB — RAPID HIV SCREEN (HIV 1/2 AB+AG)
HIV 1/2 Antibodies: NONREACTIVE
HIV-1 P24 Antigen - HIV24: NONREACTIVE

## 2018-09-07 MED ORDER — AMOXICILLIN-POT CLAVULANATE 875-125 MG PO TABS: 1.0000 | ORAL_TABLET | Freq: Two times a day (BID) | ORAL | 0 refills | Status: DC

## 2018-09-07 NOTE — ED Notes (Signed)
Bed: [facility] Expected date:  Expected time:  Means of arrival:  Comments: 
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
## 2018-09-08 MED FILL — AMOX-CLAV 875-125 MG TABLET: 875-125 | 7 days supply | Qty: 14 | Fill #0

## 2018-09-09 LAB — HEPATITIS PANEL, ACUTE
HCV Ab: <0.1 s/co ratio (ref 0.0–0.9)
Hep A IgM: NEGATIVE
Hep B C IgM: NEGATIVE
Hepatitis B Surface Ag: NEGATIVE

## 2018-09-09 LAB — HCV AB W REFLEX TO QUANT PCR: HCV Ab: <0.1 s/co ratio (ref 0.0–0.9)

## 2018-09-09 LAB — HCV INTERPRETATION

## 2018-10-03 MED FILL — LANSOPRAZOLE 30 MG CPDR: 30 | 30 days supply | Qty: 30 | Fill #11

## 2018-10-03 MED FILL — SUBVENITE 200 MG TABS: 200 | 30 days supply | Qty: 30 | Fill #1

## 2018-10-03 MED FILL — buPROPion HCL ER (XL) 150 M: 150 | 30 days supply | Qty: 90 | Fill #1

## 2018-10-26 MED FILL — CLINDAMYCIN PH 1% SOLUTION: 1 | 10 days supply | Qty: 30 | Fill #1

## 2018-10-31 DIAGNOSIS — F3340 Major depressive disorder, recurrent, in remission, unspecified: Secondary | ICD-10-CM | POA: Diagnosis not present

## 2018-10-31 MED FILL — SUBVENITE 200 MG TABS: 200 | 30 days supply | Qty: 30 | Fill #0

## 2018-10-31 MED FILL — buPROPion HCL ER (XL) 150 M: 150 | 30 days supply | Qty: 90 | Fill #0

## 2018-11-03 DIAGNOSIS — L723 Sebaceous cyst: Secondary | ICD-10-CM | POA: Diagnosis not present

## 2018-11-03 MED FILL — LANSOPRAZOLE 30 MG CPDR: 30 | 90 days supply | Qty: 90 | Fill #0

## 2018-12-05 MED FILL — SUBVENITE 200 MG TABS: 200 | 30 days supply | Qty: 30 | Fill #1

## 2018-12-05 MED FILL — buPROPion HCL ER (XL) 150 M: 150 | 30 days supply | Qty: 90 | Fill #1

## 2019-01-05 ENCOUNTER — Ambulatory Visit (INDEPENDENT_AMBULATORY_CARE_PROVIDER_SITE_OTHER): Payer: 59

## 2019-01-05 ENCOUNTER — Other Ambulatory Visit: Payer: Self-pay

## 2019-01-05 ENCOUNTER — Other Ambulatory Visit: Payer: Self-pay | Admitting: Podiatry

## 2019-01-05 ENCOUNTER — Ambulatory Visit (INDEPENDENT_AMBULATORY_CARE_PROVIDER_SITE_OTHER): Payer: 59 | Admitting: Podiatry

## 2019-01-05 ENCOUNTER — Encounter: Payer: Self-pay | Admitting: Podiatry

## 2019-01-05 VITALS — BP 130/88

## 2019-01-05 DIAGNOSIS — M79671 Pain in right foot: Secondary | ICD-10-CM

## 2019-01-05 DIAGNOSIS — M722 Plantar fascial fibromatosis: Secondary | ICD-10-CM

## 2019-01-05 DIAGNOSIS — L853 Xerosis cutis: Secondary | ICD-10-CM

## 2019-01-05 DIAGNOSIS — R234 Changes in skin texture: Secondary | ICD-10-CM

## 2019-01-05 MED FILL — SUBVENITE 200 MG TABS: 200 | 30 days supply | Qty: 30 | Fill #2

## 2019-01-05 MED FILL — buPROPion HCL ER (XL) 150 M: 150 | 30 days supply | Qty: 90 | Fill #2

## 2019-01-05 NOTE — Patient Instructions (Signed)

## 2019-01-05 NOTE — Progress Notes (Signed)
Subjective:  Patient ID: Stephen Hartman, male    DOB: 1986/01/17,  MRN: [id number]  Chief Complaint  Patient presents with  . Foot Pain    plantar fasciitis, right foot bottom of heel, pain has been going on for a couple of months    33 y.o. male presents with the above complaint. Also complains of cracking to hte bottom of his feet, and cut in between his first and 2nd toe areas.   Review of Systems: Negative except as noted in the HPI. Denies N/V/F/Ch.  Past Medical History:  Diagnosis Date  . Anxiety   . Back pain   . Bipolar 1 disorder ([place])   . Depression   . High blood pressure   . Knee pain   . Low testosterone     Current Outpatient Medications:  .  amoxicillin-clavulanate (AUGMENTIN) 875-125 MG tablet, Take 1 tablet by mouth every 12 (twelve) hours., Disp: 14 tablet, Rfl: 0 .  buPROPion (WELLBUTRIN XL) 150 MG 24 hr tablet, Take 150 mg by mouth daily., Disp: , Rfl:  .  clindamycin (CLEOCIN T) 1 % external solution, , Disp: , Rfl:  .  lamoTRIgine (LAMICTAL) 100 MG tablet, Take 1 tablet (100 mg total) by mouth daily. (Patient taking differently: Take 75 mg by mouth daily. ), Disp: 30 tablet, Rfl: 3 .  lansoprazole (PREVACID) 30 MG capsule, , Disp: , Rfl:  .  metFORMIN (GLUCOPHAGE) 500 MG tablet, Take 1 tablet (500 mg total) by mouth daily at 12 noon., Disp: 30 tablet, Rfl: 0 .  ondansetron (ZOFRAN) 4 MG tablet, TAKE 1 TABLET UP TO THREE TIMES A DAY AS NEEDED FOR NAUSEA, Disp: , Rfl:  .  ondansetron (ZOFRAN-ODT) 4 MG disintegrating tablet, TAKE ONE TABLET SUBLINGUALLY EVERY 8 HOURS AS NEEDED FOR NAUSEA, Disp: , Rfl:  .  pramipexole (MIRAPEX) 0.5 MG tablet, , Disp: , Rfl:  .  SUBVENITE 200 MG tablet, , Disp: , Rfl:  .  Vitamin D, Ergocalciferol, (DRISDOL) 50000 units CAPS capsule, Take 1 capsule (50,000 Units total) by mouth every 7 (seven) days., Disp: 4 capsule, Rfl: 0  Social History   Tobacco Use  Smoking Status Former Smoker  . Years: 6.00  . Quit date: 2016  .  Years since quitting: 4.7  Smokeless Tobacco Never Used    Allergies  Allergen Reactions  . Erythromycin Nausea And Vomiting   Objective:   Vitals:   01/05/19 0939  BP: 130/88   There is no height or weight on file to calculate BMI. Constitutional Well developed. Well nourished.  Vascular Dorsalis pedis pulses palpable bilaterally. Posterior tibial pulses palpable bilaterally. Capillary refill normal to all digits.  No cyanosis or clubbing noted. Pedal hair growth normal.  Neurologic Normal speech. Oriented to person, place, and time. Epicritic sensation to light touch grossly present bilaterally.  Dermatologic Nails well groomed and normal in appearance. Hyperkeratosis and multiple skin fissures bilat feet. No signs of infection.  Orthopedic: Normal joint ROM without pain or crepitus bilaterally. No visible deformities.  Tender to palpation at the calcaneal tuber right. No pain with calcaneal squeeze right. Ankle ROM diminished range of motion right. Silfverskiold Test: positive right.   Radiographs: Taken and reviewed. No acute fractures or dislocations. No evidence of stress fracture.  Plantar heel spur present. Posterior heel spur absent.   Assessment:   1. Plantar fasciitis   2. Fissured skin   3. Xerosis cutis    Plan:  Patient was evaluated and treated and all questions answered.  Plantar Fasciitis, right - XR reviewed as above.  - Educated on icing and stretching. Instructions given.  - Injection delivered to the plantar fascia as below. - DME: PF brace dispensed.  Xerosis with Skin fissuring, congenital hyperkeratosis -Dispense urea cream educated on use.   Return in about 4 weeks (around 02/02/2019).

## 2019-01-23 DIAGNOSIS — F3340 Major depressive disorder, recurrent, in remission, unspecified: Secondary | ICD-10-CM | POA: Diagnosis not present

## 2019-02-07 MED FILL — LANSOPRAZOLE 30 MG CPDR: 30 | 90 days supply | Qty: 90 | Fill #1

## 2019-02-07 MED FILL — SUBVENITE 200 MG TABS: 200 | 30 days supply | Qty: 30 | Fill #0

## 2019-02-07 MED FILL — buPROPion HCL ER (XL) 150 M: 150 | 30 days supply | Qty: 90 | Fill #0

## 2019-02-14 DIAGNOSIS — M545 Low back pain: Secondary | ICD-10-CM | POA: Diagnosis not present

## 2019-02-14 DIAGNOSIS — R269 Unspecified abnormalities of gait and mobility: Secondary | ICD-10-CM | POA: Diagnosis not present

## 2019-03-10 MED FILL — buPROPion HCL ER (XL) 150 M: 150 | 30 days supply | Qty: 90 | Fill #1

## 2019-03-10 MED FILL — SUBVENITE 200 MG TABS: 200 | 30 days supply | Qty: 30 | Fill #1

## 2019-04-11 MED FILL — SUBVENITE 200 MG TABS: 200 | 30 days supply | Qty: 30 | Fill #2

## 2019-04-11 MED FILL — BUPROPION HCL XL 150 MG TAB: 150 | 30 days supply | Qty: 90 | Fill #2

## 2019-05-05 MED FILL — buPROPion HCL ER (XL) 150 M: 150 | 30 days supply | Qty: 90 | Fill #3

## 2019-05-05 MED FILL — LANSOPRAZOLE 30 MG CPDR: 30 | 90 days supply | Qty: 90 | Fill #2

## 2019-06-05 MED FILL — lamoTRIgine 200 MG TABS: 200 | 30 days supply | Qty: 30 | Fill #0

## 2019-06-05 MED FILL — buPROPion HCL ER (XL) 150 M: 150 | 30 days supply | Qty: 90 | Fill #0

## 2019-06-30 ENCOUNTER — Ambulatory Visit: Payer: 59

## 2019-07-13 MED FILL — buPROPion HCL ER (XL) 150 M: 150 | 30 days supply | Qty: 90 | Fill #1

## 2019-07-13 MED FILL — lamoTRIgine 200 MG TABS: 200 | 30 days supply | Qty: 30 | Fill #1

## 2019-08-08 ENCOUNTER — Other Ambulatory Visit (HOSPITAL_COMMUNITY): Payer: Self-pay | Admitting: Family Medicine

## 2019-08-08 MED FILL — LANSOPRAZOLE 30 MG CPDR: 30 | 90 days supply | Qty: 90 | Fill #0

## 2019-08-08 MED FILL — buPROPion HCL ER (XL) 150 M: 150 | 30 days supply | Qty: 90 | Fill #2

## 2019-10-14 MED FILL — buPROPion HCL ER (XL) 150 M: 150 | 30 days supply | Qty: 90 | Fill #4

## 2019-11-07 MED FILL — buPROPion HCL ER (XL) 150 M: 150 | 30 days supply | Qty: 90 | Fill #5

## 2019-11-07 MED FILL — LANSOPRAZOLE 30 MG CPDR: 30 | 90 days supply | Qty: 90 | Fill #1

## 2019-11-07 MED FILL — SUBVENITE 200 MG TABS: 200 | 30 days supply | Qty: 30 | Fill #2

## 2019-11-13 ENCOUNTER — Other Ambulatory Visit (HOSPITAL_COMMUNITY): Payer: Self-pay

## 2019-12-12 MED FILL — SUBVENITE 200 MG TABS: 200 | 90 days supply | Qty: 90 | Fill #0

## 2019-12-12 MED FILL — buPROPion HCL ER (XL) 150 M: 150 | 90 days supply | Qty: 270 | Fill #0

## 2020-02-17 MED FILL — LANSOPRAZOLE 30 MG CPDR: 30 | 90 days supply | Qty: 90 | Fill #2

## 2020-03-12 MED FILL — SUBVENITE 200 MG TABS: 200 | 90 days supply | Qty: 90 | Fill #1

## 2020-03-12 MED FILL — buPROPion HCL ER (XL) 150 M: 150 | 90 days supply | Qty: 270 | Fill #1

## 2020-05-22 ENCOUNTER — Other Ambulatory Visit (HOSPITAL_COMMUNITY): Payer: Self-pay

## 2020-05-23 MED FILL — SUBVENITE 200 MG TABS: 200 | 90 days supply | Qty: 90 | Fill #0

## 2020-05-25 MED FILL — LANSOPRAZOLE 30 MG CPDR: 30 | 90 days supply | Qty: 90 | Fill #3

## 2020-05-25 MED FILL — buPROPion HCL ER (XL) 150 M: 150 | 90 days supply | Qty: 270 | Fill #0

## 2020-08-21 ENCOUNTER — Other Ambulatory Visit (HOSPITAL_COMMUNITY): Payer: Self-pay

## 2020-08-21 MED FILL — Bupropion HCl Tab ER 24HR 150 MG: ORAL | 90 days supply | Qty: 270 | Fill #0 | Status: AC

## 2020-08-21 MED FILL — Lamotrigine Tab 200 MG: ORAL | 90 days supply | Qty: 90 | Fill #0 | Status: AC

## 2020-08-22 ENCOUNTER — Other Ambulatory Visit (HOSPITAL_COMMUNITY): Payer: Self-pay

## 2020-08-22 MED ORDER — LANSOPRAZOLE 30 MG PO CPDR
DELAYED_RELEASE_CAPSULE | ORAL | 3 refills | Status: DC
  Filled 2020-08-22: qty 90, 90d supply, fill #0
  Filled 2020-11-22: qty 90, 90d supply, fill #1
  Filled 2021-02-21: qty 90, 90d supply, fill #2
  Filled 2021-05-27: qty 90, 90d supply, fill #3

## 2020-08-23 ENCOUNTER — Other Ambulatory Visit (HOSPITAL_COMMUNITY): Payer: Self-pay

## 2020-08-26 ENCOUNTER — Other Ambulatory Visit (HOSPITAL_COMMUNITY): Payer: Self-pay

## 2020-10-04 ENCOUNTER — Other Ambulatory Visit: Payer: Self-pay

## 2020-10-04 ENCOUNTER — Other Ambulatory Visit (HOSPITAL_COMMUNITY): Payer: Self-pay

## 2020-10-04 ENCOUNTER — Emergency Department (HOSPITAL_BASED_OUTPATIENT_CLINIC_OR_DEPARTMENT_OTHER): Payer: No Typology Code available for payment source

## 2020-10-04 ENCOUNTER — Emergency Department (HOSPITAL_BASED_OUTPATIENT_CLINIC_OR_DEPARTMENT_OTHER)
Admission: EM | Admit: 2020-10-04 | Discharge: 2020-10-04 | Disposition: A | Discharge status: Home / Self Care | Payer: No Typology Code available for payment source | Attending: Emergency Medicine | Admitting: Emergency Medicine

## 2020-10-04 ENCOUNTER — Encounter (HOSPITAL_BASED_OUTPATIENT_CLINIC_OR_DEPARTMENT_OTHER): Payer: Self-pay

## 2020-10-04 DIAGNOSIS — M545 Low back pain, unspecified: Secondary | ICD-10-CM | POA: Diagnosis not present

## 2020-10-04 DIAGNOSIS — R1030 Lower abdominal pain, unspecified: Secondary | ICD-10-CM | POA: Insufficient documentation

## 2020-10-04 DIAGNOSIS — Y92410 Unspecified street and highway as the place of occurrence of the external cause: Secondary | ICD-10-CM | POA: Insufficient documentation

## 2020-10-04 DIAGNOSIS — Z87891 Personal history of nicotine dependence: Secondary | ICD-10-CM | POA: Insufficient documentation

## 2020-10-04 DIAGNOSIS — I10 Essential (primary) hypertension: Secondary | ICD-10-CM | POA: Insufficient documentation

## 2020-10-04 DIAGNOSIS — R519 Headache, unspecified: Secondary | ICD-10-CM | POA: Diagnosis not present

## 2020-10-04 MED ORDER — CYCLOBENZAPRINE HCL 10 MG PO TABS
10.0000 mg | ORAL_TABLET | Freq: Two times a day (BID) | ORAL | 0 refills | Status: DC | PRN
  Filled 2020-10-04: qty 20, 10d supply, fill #0
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
## 2020-10-04 NOTE — Discharge Instructions (Addendum)
Exam and imaging are all reassuring.  Possible that you may be suffering from a slight concussion, recommend over-the-counter pain medications like ibuprofen Tylenol every 6 hours needed please follow dosing on the back of bottle.  I recommend brain rest, i.e. decreasing screen time, activities that are mentally stimulating and reintroduce them as tolerated.  Given you Flexeril for lower back pain please take as needed.  Please be aware this medication can make you drowsy do not consume alcohol or operate heavy machinery when taking this medication.  Please follow-up with the concussion clinic if symptoms do not improve after 1 week's time.  Come back to emergency department if you have severe headaches, change in vision, numbness or tingling in the arms or legs, slurring words, have uncontrolled nausea, vomiting, become altered.

## 2020-10-08 ENCOUNTER — Telehealth: Payer: Self-pay | Admitting: *Deleted
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
## 2020-10-09 NOTE — Telephone Encounter (Signed)
Spoke w/ pt's wife and scheduled him for a visit on 10/11/20
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
## 2020-10-11 ENCOUNTER — Ambulatory Visit (INDEPENDENT_AMBULATORY_CARE_PROVIDER_SITE_OTHER): Payer: No Typology Code available for payment source | Admitting: Family Medicine

## 2020-10-11 ENCOUNTER — Other Ambulatory Visit (HOSPITAL_COMMUNITY): Payer: Self-pay

## 2020-10-11 ENCOUNTER — Other Ambulatory Visit: Payer: Self-pay

## 2020-10-11 ENCOUNTER — Encounter: Payer: Self-pay | Admitting: Family Medicine

## 2020-10-11 VITALS — BP 140/90 | HR 86 | Ht 71.0 in | Wt 355.4 lb

## 2020-10-11 DIAGNOSIS — S060X0A Concussion without loss of consciousness, initial encounter: Secondary | ICD-10-CM | POA: Diagnosis not present

## 2020-10-11 DIAGNOSIS — R413 Other amnesia: Secondary | ICD-10-CM

## 2020-10-11 MED ORDER — NORTRIPTYLINE HCL 25 MG PO CAPS
25.0000 mg | ORAL_CAPSULE | Freq: Every day | ORAL | 2 refills | Status: DC
  Filled 2020-10-11: qty 30, 30d supply, fill #0

## 2020-10-11 NOTE — Patient Instructions (Addendum)
Thank you for coming in today.   Take nortriptyline at bedtime. This will help headache and should help sleep.  If it is more annoying than helpful stop it and let me know.   Plan for speech therapy.   Recheck in about 2 weeks.   Let me know if this is not working.   I will do FMLA paperwork.   Concussion, Adult  A concussion is a brain injury from a hard, direct hit (trauma) to your head or body. This direct hit causes your brain to quickly shake back and forth inside your skull. A concussion may also be called a mild traumaticbrain injury (TBI). Healing from this injury can take time. What are the causes? This condition is caused by: A direct hit to your head, such as: Running into a player during a game. Being hit in a fight. Hitting your head on a hard surface. A quick and sudden movement of the head or neck, such as in a car crash. What are the signs or symptoms? The signs of a concussion can be hard to notice. They may be missed by you, family members, and doctors. You may look fine on the outside but may not actor feel normal. Physical symptoms Headaches. Being dizzy. Problems with body balance. Being sensitive to light or noise. Vomiting or feeling like you may vomit. Being tired. Problems seeing or hearing. Not sleeping or eating as you used to. Seizure. Mental and emotional symptoms Feeling grouchy (irritable). Having mood changes. Problems remembering things. Trouble focusing your mind (concentrating), organizing, or making decisions. Being slow to think, act, react, speak, or read. Feeling worried or nervous (anxious). Feeling sad (depressed). How is this treated? This condition may be treated by: Stopping sports or activity if you are injured. If you hit your head or have signs of concussion: Do not return to sports or activities the same day. Get checked by a doctor before you return to your activities. Resting your body and your mind. Being watched  carefully, often at home. Medicines to help with symptoms such as: Headaches. Feeling like you may vomit. Problems with sleep. Avoiding alcohol and drugs. Being asked to go to a concussion clinic or a place to help you recover (rehabilitation center). Recovery from a concussion can take time. Return to activities only: When you are fully healed. When your doctor says it is safe. Avoid taking strong pain medicines (opioids) for a concussion. Follow these instructions at home: Activity Limit activities that need a lot of thought or focus, such as: Homework or work for your job. Watching TV. Using the computer or phone. Playing memory games and puzzles. Rest. Rest helps your brain heal. Make sure you: Get plenty of sleep. Most adults should get 7-9 hours of sleep each night. Rest during the day. Take naps or breaks when you feel tired. Avoid activity like exercise until your doctor says its safe. Stop any activity that makes symptoms worse. Do not do activities that could cause a second concussion, such as riding a bike or playing sports. Ask your doctor when you can return to your normal activities, such as school, work, sports, and driving. Your ability to react may be slower. Do not do these activities if you are dizzy. General instructions  Take over-the-counter and prescription medicines only as told by your doctor. Do not drink alcohol until your doctor says you can. Watch your symptoms and tell other people to do the same. Other problems can occur after a concussion. Older  adults have a higher risk of serious problems. Tell your work Freight forwarder, teachers, Government social research officer, school counselor, coach, or Product/process development scientist about your injury and symptoms. Tell them about what you can or cannot do. Keep all follow-up visits as told by your doctor. This is important.  How is this prevented? It is very important that you do not get another brain injury. In rare cases, another injury can cause  brain damage that will not go away, brain swelling, or death. The risk of this is greatest in the first 7-10 days after a head injury. To avoid injuries: Stop activities that could lead to a second concussion, such as contact sports, until your doctor says it is okay. When you return to sports or activities: Do not crash into other players. This is how most concussions happen. Follow the rules. Respect other players. Do not engage in violent behavior while playing. Get regular exercise. Do strength and balance training. Wear a helmet that fits you well during sports, biking, or other activities. Helmets can help protect you from serious skull and brain injuries, but they do not protect you from a concussion. Even when wearing a helmet, you should avoid being hit in the head. Contact a doctor if: Your symptoms do not get better. You have new symptoms. You have another injury. Get help right away if: You have bad headaches or your headaches get worse. You feel weak or numb in any part of your body. You feel mixed up (confused). Your balance gets worse. You vomit often. You feel more sleepy than normal. You cannot speak well, or have slurred speech. You have a seizure. Others have trouble waking you up. You have changes in how you act. You have changes in how you see (vision). You pass out (lose consciousness). These symptoms may be an emergency. Do not wait to see if the symptoms will go away. Get medical help right away. Call your local emergency services (911 in the [place] Do not drive yourself to the hospital. Summary A concussion is a brain injury from a hard, direct hit (trauma) to your head or body. This condition is treated with rest and careful watching of symptoms. Ask your doctor when you can return to your normal activities, such as school, work, or driving. Get help right away if you have a very bad headache, feel weak in any part of your body, have a seizure, have changes in  how you act or see, or if you are mixed up or more sleepy than normal. This information is not intended to replace advice given to you by your health care provider. Make sure you discuss any questions you have with your healthcare provider. Document Revised: 02/02/2019 Document Reviewed: 02/02/2019 Elsevier Patient Education  [employer].

## 2020-10-16 ENCOUNTER — Other Ambulatory Visit: Payer: Self-pay

## 2020-10-16 ENCOUNTER — Encounter: Payer: Self-pay | Admitting: Speech Pathology

## 2020-10-16 ENCOUNTER — Ambulatory Visit: Payer: No Typology Code available for payment source | Attending: Family Medicine | Admitting: Speech Pathology

## 2020-10-16 DIAGNOSIS — R41841 Cognitive communication deficit: Secondary | ICD-10-CM | POA: Insufficient documentation
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
## 2020-10-17 ENCOUNTER — Encounter: Payer: Self-pay | Admitting: Speech Pathology

## 2020-10-17 ENCOUNTER — Ambulatory Visit: Payer: No Typology Code available for payment source | Admitting: Speech Pathology

## 2020-10-17 DIAGNOSIS — R41841 Cognitive communication deficit: Secondary | ICD-10-CM | POA: Diagnosis not present
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
## 2020-10-18 ENCOUNTER — Encounter: Payer: Self-pay | Admitting: Speech Pathology

## 2020-10-18 ENCOUNTER — Other Ambulatory Visit: Payer: Self-pay

## 2020-10-18 ENCOUNTER — Ambulatory Visit: Payer: No Typology Code available for payment source | Admitting: Speech Pathology

## 2020-10-18 DIAGNOSIS — R41841 Cognitive communication deficit: Secondary | ICD-10-CM
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
## 2020-10-25 ENCOUNTER — Other Ambulatory Visit: Payer: Self-pay

## 2020-10-25 ENCOUNTER — Encounter: Payer: Self-pay | Admitting: Family Medicine

## 2020-10-25 ENCOUNTER — Ambulatory Visit (INDEPENDENT_AMBULATORY_CARE_PROVIDER_SITE_OTHER): Payer: No Typology Code available for payment source | Admitting: Family Medicine

## 2020-10-25 VITALS — BP 128/88 | HR 85 | Ht 71.0 in | Wt 359.2 lb

## 2020-10-25 DIAGNOSIS — R400 Somnolence: Secondary | ICD-10-CM | POA: Diagnosis not present

## 2020-10-25 DIAGNOSIS — S060X0A Concussion without loss of consciousness, initial encounter: Secondary | ICD-10-CM

## 2020-10-25 DIAGNOSIS — R5383 Other fatigue: Secondary | ICD-10-CM

## 2020-10-25 NOTE — Patient Instructions (Addendum)
Thank you for coming in today.   Try the nortriptyline at bedtime as needed.   Recheck as needed.   Return to work on Friday the 29th.   Let me know if this is not going well.   Plan for sleep medicine referral.

## 2020-11-01 ENCOUNTER — Ambulatory Visit: Payer: No Typology Code available for payment source | Admitting: Speech Pathology

## 2020-11-04 ENCOUNTER — Ambulatory Visit: Payer: No Typology Code available for payment source | Admitting: Speech Pathology

## 2020-11-05 ENCOUNTER — Other Ambulatory Visit: Payer: Self-pay

## 2020-11-05 ENCOUNTER — Ambulatory Visit: Payer: No Typology Code available for payment source | Attending: Family Medicine | Admitting: Speech Pathology

## 2020-11-05 DIAGNOSIS — R41841 Cognitive communication deficit: Secondary | ICD-10-CM | POA: Insufficient documentation
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
## 2020-11-07 ENCOUNTER — Other Ambulatory Visit (HOSPITAL_COMMUNITY): Payer: Self-pay

## 2020-11-07 MED ORDER — ERGOCALCIFEROL 1.25 MG (50000 UT) PO CAPS
50000.0000 [IU] | ORAL_CAPSULE | ORAL | 0 refills | Status: DC
  Filled 2020-11-07: qty 8, 56d supply, fill #0

## 2020-11-15 ENCOUNTER — Other Ambulatory Visit (HOSPITAL_COMMUNITY): Payer: Self-pay

## 2020-11-15 ENCOUNTER — Other Ambulatory Visit (HOSPITAL_BASED_OUTPATIENT_CLINIC_OR_DEPARTMENT_OTHER): Payer: Self-pay

## 2020-11-15 DIAGNOSIS — G4710 Hypersomnia, unspecified: Secondary | ICD-10-CM

## 2020-11-15 DIAGNOSIS — R0683 Snoring: Secondary | ICD-10-CM

## 2020-11-15 DIAGNOSIS — R0681 Apnea, not elsewhere classified: Secondary | ICD-10-CM

## 2020-11-20 ENCOUNTER — Other Ambulatory Visit (HOSPITAL_COMMUNITY): Payer: Self-pay

## 2020-11-22 ENCOUNTER — Other Ambulatory Visit (HOSPITAL_COMMUNITY): Payer: Self-pay

## 2020-11-23 ENCOUNTER — Other Ambulatory Visit (HOSPITAL_COMMUNITY): Payer: Self-pay

## 2020-11-25 ENCOUNTER — Other Ambulatory Visit (HOSPITAL_COMMUNITY): Payer: Self-pay

## 2020-11-26 ENCOUNTER — Other Ambulatory Visit (HOSPITAL_COMMUNITY): Payer: Self-pay

## 2020-11-28 ENCOUNTER — Other Ambulatory Visit (HOSPITAL_COMMUNITY): Payer: Self-pay

## 2020-11-29 ENCOUNTER — Other Ambulatory Visit (HOSPITAL_COMMUNITY): Payer: Self-pay

## 2020-12-04 ENCOUNTER — Other Ambulatory Visit (HOSPITAL_COMMUNITY): Payer: Self-pay

## 2020-12-04 MED ORDER — LAMOTRIGINE 200 MG PO TABS
ORAL_TABLET | ORAL | 1 refills | Status: DC
  Filled 2020-12-04: qty 90, 90d supply, fill #0
  Filled 2021-02-21: qty 90, 90d supply, fill #1

## 2020-12-04 MED ORDER — BUPROPION HCL ER (XL) 150 MG PO TB24
ORAL_TABLET | ORAL | 1 refills | Status: DC
  Filled 2020-12-04: qty 270, 90d supply, fill #0
  Filled 2021-02-21: qty 270, 90d supply, fill #1

## 2020-12-24 ENCOUNTER — Other Ambulatory Visit (HOSPITAL_COMMUNITY): Payer: Self-pay

## 2020-12-25 ENCOUNTER — Other Ambulatory Visit (HOSPITAL_COMMUNITY): Payer: Self-pay

## 2021-02-21 ENCOUNTER — Other Ambulatory Visit (HOSPITAL_COMMUNITY): Payer: Self-pay

## 2021-03-13 ENCOUNTER — Other Ambulatory Visit: Payer: Self-pay

## 2021-03-13 ENCOUNTER — Emergency Department (HOSPITAL_COMMUNITY): Payer: PRIVATE HEALTH INSURANCE

## 2021-03-13 ENCOUNTER — Emergency Department (HOSPITAL_COMMUNITY)
Admission: EM | Admit: 2021-03-13 | Discharge: 2021-03-13 | Disposition: A | Discharge status: Home / Self Care | Payer: PRIVATE HEALTH INSURANCE | Attending: Emergency Medicine | Admitting: Emergency Medicine

## 2021-03-13 ENCOUNTER — Encounter (HOSPITAL_COMMUNITY): Payer: Self-pay

## 2021-03-13 DIAGNOSIS — Y990 Civilian activity done for income or pay: Secondary | ICD-10-CM | POA: Diagnosis not present

## 2021-03-13 DIAGNOSIS — Z87891 Personal history of nicotine dependence: Secondary | ICD-10-CM | POA: Diagnosis not present

## 2021-03-13 DIAGNOSIS — S0990XA Unspecified injury of head, initial encounter: Secondary | ICD-10-CM | POA: Diagnosis present

## 2021-03-13 DIAGNOSIS — S0083XA Contusion of other part of head, initial encounter: Secondary | ICD-10-CM | POA: Diagnosis not present

## 2021-03-13 NOTE — Discharge Instructions (Signed)
Take Tylenol or Motrin for pain.  Follow-up if any problems
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
## 2021-03-20 ENCOUNTER — Other Ambulatory Visit (HOSPITAL_COMMUNITY): Payer: Self-pay

## 2021-03-20 MED ORDER — SAXENDA 18 MG/3ML ~~LOC~~ SOPN
PEN_INJECTOR | SUBCUTANEOUS | 8 refills | Status: DC
  Filled 2021-03-20 – 2021-04-01 (×2): qty 15, 30d supply, fill #0
  Filled 2021-05-10: qty 15, 30d supply, fill #1

## 2021-03-22 ENCOUNTER — Other Ambulatory Visit (HOSPITAL_COMMUNITY): Payer: Self-pay

## 2021-04-01 ENCOUNTER — Other Ambulatory Visit (HOSPITAL_COMMUNITY): Payer: Self-pay

## 2021-04-01 MED ORDER — UNIFINE PENTIPS 32G X 4 MM MISC
4 refills | Status: AC
  Filled 2021-04-01: qty 100, 90d supply, fill #0
  Filled 2021-07-10: qty 100, 90d supply, fill #1

## 2021-04-16 ENCOUNTER — Other Ambulatory Visit (HOSPITAL_COMMUNITY): Payer: Self-pay

## 2021-04-16 MED ORDER — METHYLPHENIDATE HCL ER (CD) 20 MG PO CPCR
ORAL_CAPSULE | ORAL | 0 refills | Status: DC
  Filled 2021-04-16: qty 30, 30d supply, fill #0

## 2021-05-10 ENCOUNTER — Other Ambulatory Visit (HOSPITAL_COMMUNITY): Payer: Self-pay

## 2021-05-12 ENCOUNTER — Other Ambulatory Visit (HOSPITAL_COMMUNITY): Payer: Self-pay

## 2021-05-13 ENCOUNTER — Other Ambulatory Visit (HOSPITAL_COMMUNITY): Payer: Self-pay

## 2021-05-14 ENCOUNTER — Other Ambulatory Visit (HOSPITAL_COMMUNITY): Payer: Self-pay

## 2021-05-15 ENCOUNTER — Other Ambulatory Visit (HOSPITAL_COMMUNITY): Payer: Self-pay

## 2021-05-27 ENCOUNTER — Other Ambulatory Visit (HOSPITAL_COMMUNITY): Payer: Self-pay

## 2021-05-27 MED ORDER — METHYLPHENIDATE HCL ER (CD) 20 MG PO CPCR
20.0000 mg | ORAL_CAPSULE | Freq: Every day | ORAL | 0 refills | Status: DC
  Filled 2021-05-27: qty 30, 30d supply, fill #0

## 2021-05-28 ENCOUNTER — Other Ambulatory Visit (HOSPITAL_COMMUNITY): Payer: Self-pay

## 2021-05-29 ENCOUNTER — Other Ambulatory Visit (HOSPITAL_COMMUNITY): Payer: Self-pay

## 2021-06-06 ENCOUNTER — Other Ambulatory Visit (HOSPITAL_COMMUNITY): Payer: Self-pay

## 2021-06-10 ENCOUNTER — Other Ambulatory Visit (HOSPITAL_COMMUNITY): Payer: Self-pay

## 2021-06-11 ENCOUNTER — Other Ambulatory Visit (HOSPITAL_COMMUNITY): Payer: Self-pay

## 2021-06-11 MED ORDER — LAMOTRIGINE 200 MG PO TABS
ORAL_TABLET | ORAL | 0 refills | Status: DC
  Filled 2021-06-11: qty 60, 60d supply, fill #0

## 2021-06-11 MED ORDER — BUPROPION HCL ER (XL) 150 MG PO TB24
ORAL_TABLET | ORAL | 0 refills | Status: DC
  Filled 2021-06-11: qty 180, 60d supply, fill #0

## 2021-06-11 MED ORDER — SAXENDA 18 MG/3ML ~~LOC~~ SOPN
PEN_INJECTOR | SUBCUTANEOUS | 8 refills | Status: DC
  Filled 2021-06-11: qty 15, 30d supply, fill #0
  Filled 2021-07-10: qty 15, 30d supply, fill #1
  Filled 2021-08-09: qty 15, 30d supply, fill #2
  Filled 2021-09-12 – 2021-09-24 (×2): qty 15, 30d supply, fill #3
  Filled 2021-10-31: qty 15, 30d supply, fill #4
  Filled 2021-12-06: qty 15, 30d supply, fill #5
  Filled 2022-02-17: qty 15, 30d supply, fill #6
  Filled 2022-04-07: qty 15, 30d supply, fill #7

## 2021-06-12 ENCOUNTER — Other Ambulatory Visit (HOSPITAL_COMMUNITY): Payer: Self-pay

## 2021-07-07 ENCOUNTER — Ambulatory Visit (HOSPITAL_BASED_OUTPATIENT_CLINIC_OR_DEPARTMENT_OTHER): Payer: Self-pay | Attending: Internal Medicine | Admitting: Internal Medicine

## 2021-07-10 ENCOUNTER — Other Ambulatory Visit (HOSPITAL_COMMUNITY): Payer: Self-pay

## 2021-08-01 ENCOUNTER — Ambulatory Visit (HOSPITAL_COMMUNITY)
Admission: EM | Admit: 2021-08-01 | Discharge: 2021-08-01 | Disposition: A | Discharge status: Home / Self Care | Payer: Self-pay | Attending: Internal Medicine | Admitting: Internal Medicine

## 2021-08-01 ENCOUNTER — Ambulatory Visit (INDEPENDENT_AMBULATORY_CARE_PROVIDER_SITE_OTHER): Payer: Self-pay

## 2021-08-01 ENCOUNTER — Encounter (HOSPITAL_COMMUNITY): Payer: Self-pay

## 2021-08-01 ENCOUNTER — Other Ambulatory Visit (HOSPITAL_COMMUNITY): Payer: Self-pay

## 2021-08-01 DIAGNOSIS — M792 Neuralgia and neuritis, unspecified: Secondary | ICD-10-CM

## 2021-08-01 DIAGNOSIS — M545 Low back pain, unspecified: Secondary | ICD-10-CM

## 2021-08-01 MED ORDER — GABAPENTIN 300 MG PO CAPS
300.0000 mg | ORAL_CAPSULE | Freq: Every day | ORAL | 0 refills | Status: DC
  Filled 2021-08-01: qty 15, 15d supply, fill #0

## 2021-08-01 NOTE — ED Triage Notes (Signed)
Pt presents bilateral lower back pain that radiates down into legs X 6 days. ?

## 2021-08-01 NOTE — Discharge Instructions (Signed)
Your x-ray of your lumbar spine is normal and is not any different than your last x-ray completed last year.  I believe that your pain is nerve related and not musculoskeletal.  I have prescribed gabapentin for you to take at nighttime for the pain.  I have also prescribed naproxen for you to take with food every 12 hours for the next 5 days.  The naproxen will decrease the inflammation that could be causing your pain.  Gabapentin may make you sleepy, so please only take this at night.  You may take naproxen during the day for your pain. ? ?If you suddenly develop any new or worsening symptoms, please return to urgent care.  If your symptoms are severe please go to the emergency department.  Please follow-up with your primary care provider regarding your nerve pain in the next week for further evaluation and management. ?
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
## 2021-08-07 ENCOUNTER — Other Ambulatory Visit (HOSPITAL_COMMUNITY): Payer: Self-pay

## 2021-08-07 MED ORDER — PREDNISONE 20 MG PO TABS
ORAL_TABLET | ORAL | 0 refills | Status: DC
  Filled 2021-08-07: qty 12, 6d supply, fill #0

## 2021-08-09 ENCOUNTER — Other Ambulatory Visit (HOSPITAL_COMMUNITY): Payer: Self-pay

## 2021-08-14 ENCOUNTER — Other Ambulatory Visit (HOSPITAL_COMMUNITY): Payer: Self-pay

## 2021-08-14 MED ORDER — BUPROPION HCL ER (XL) 150 MG PO TB24
ORAL_TABLET | ORAL | 1 refills | Status: DC
  Filled 2021-08-14: qty 270, 90d supply, fill #0
  Filled 2021-11-15: qty 255, 85d supply, fill #1
  Filled 2021-11-15: qty 15, 5d supply, fill #1

## 2021-08-14 MED ORDER — LAMOTRIGINE 200 MG PO TABS
ORAL_TABLET | ORAL | 1 refills | Status: DC
  Filled 2021-08-14: qty 90, 90d supply, fill #0
  Filled 2021-11-15: qty 90, 90d supply, fill #1

## 2021-08-14 MED ORDER — HYDROCODONE-ACETAMINOPHEN 10-325 MG PO TABS
ORAL_TABLET | ORAL | 0 refills | Status: DC
  Filled 2021-08-14: qty 15, 4d supply, fill #0

## 2021-08-18 ENCOUNTER — Other Ambulatory Visit: Payer: Self-pay | Admitting: Sports Medicine

## 2021-08-18 DIAGNOSIS — M48061 Spinal stenosis, lumbar region without neurogenic claudication: Secondary | ICD-10-CM

## 2021-08-21 ENCOUNTER — Ambulatory Visit
Admission: RE | Admit: 2021-08-21 | Discharge: 2021-08-21 | Disposition: A | Discharge status: Home / Self Care | Payer: No Typology Code available for payment source | Source: Ambulatory Visit | Attending: Sports Medicine | Admitting: Sports Medicine

## 2021-08-21 ENCOUNTER — Inpatient Hospital Stay: Admission: RE | Admit: 2021-08-21 | Payer: Self-pay | Source: Ambulatory Visit

## 2021-08-21 DIAGNOSIS — M48061 Spinal stenosis, lumbar region without neurogenic claudication: Secondary | ICD-10-CM

## 2021-08-21 MED ORDER — IOPAMIDOL (ISOVUE-M 200) INJECTION 41%
1.0000 mL | Freq: Once | INTRAMUSCULAR | Status: AC
Start: 2021-08-21 — End: 2021-08-21
  Administered 2021-08-21: 1 mL via EPIDURAL

## 2021-08-21 MED ORDER — METHYLPREDNISOLONE ACETATE 40 MG/ML INJ SUSP (RADIOLOG
80.0000 mg | Freq: Once | INTRAMUSCULAR | Status: AC
  Administered 2021-08-21: 80 mg via EPIDURAL

## 2021-08-21 NOTE — Discharge Instructions (Signed)

## 2021-09-12 ENCOUNTER — Other Ambulatory Visit (HOSPITAL_COMMUNITY): Payer: Self-pay

## 2021-09-12 MED ORDER — LANSOPRAZOLE 30 MG PO CPDR
30.0000 mg | DELAYED_RELEASE_CAPSULE | Freq: Every day | ORAL | 0 refills | Status: DC
  Filled 2021-09-12 – 2021-09-24 (×2): qty 90, 90d supply, fill #0

## 2021-09-22 ENCOUNTER — Other Ambulatory Visit (HOSPITAL_COMMUNITY): Payer: Self-pay

## 2021-09-24 ENCOUNTER — Other Ambulatory Visit (HOSPITAL_COMMUNITY): Payer: Self-pay

## 2021-10-27 ENCOUNTER — Emergency Department (HOSPITAL_COMMUNITY): Payer: PRIVATE HEALTH INSURANCE

## 2021-10-27 ENCOUNTER — Emergency Department (HOSPITAL_COMMUNITY)
Admission: EM | Admit: 2021-10-27 | Discharge: 2021-10-27 | Disposition: A | Discharge status: Home / Self Care | Payer: PRIVATE HEALTH INSURANCE | Attending: Emergency Medicine | Admitting: Emergency Medicine

## 2021-10-27 ENCOUNTER — Other Ambulatory Visit: Payer: Self-pay

## 2021-10-27 ENCOUNTER — Encounter (HOSPITAL_COMMUNITY): Payer: Self-pay

## 2021-10-27 DIAGNOSIS — S4991XA Unspecified injury of right shoulder and upper arm, initial encounter: Secondary | ICD-10-CM | POA: Diagnosis not present

## 2021-10-27 DIAGNOSIS — M5442 Lumbago with sciatica, left side: Secondary | ICD-10-CM | POA: Diagnosis not present

## 2021-10-27 DIAGNOSIS — S60031A Contusion of right middle finger without damage to nail, initial encounter: Secondary | ICD-10-CM | POA: Insufficient documentation

## 2021-10-27 DIAGNOSIS — Y990 Civilian activity done for income or pay: Secondary | ICD-10-CM | POA: Insufficient documentation

## 2021-10-27 DIAGNOSIS — M5432 Sciatica, left side: Secondary | ICD-10-CM

## 2021-10-27 DIAGNOSIS — M25511 Pain in right shoulder: Secondary | ICD-10-CM | POA: Diagnosis present

## 2021-10-27 MED ORDER — KETOROLAC TROMETHAMINE 10 MG PO TABS
10.0000 mg | ORAL_TABLET | Freq: Four times a day (QID) | ORAL | 0 refills | Status: DC | PRN
  Filled 2021-10-27: qty 20, 5d supply, fill #0

## 2021-10-27 MED ORDER — CYCLOBENZAPRINE HCL 10 MG PO TABS
10.0000 mg | ORAL_TABLET | Freq: Two times a day (BID) | ORAL | 0 refills | Status: DC | PRN
  Filled 2021-10-27: qty 20, 10d supply, fill #0

## 2021-10-27 MED ORDER — KETOROLAC TROMETHAMINE 30 MG/ML IJ SOLN
30.0000 mg | Freq: Once | INTRAMUSCULAR | Status: AC
  Administered 2021-10-27: 30 mg via INTRAMUSCULAR
  Filled 2021-10-27: qty 1

## 2021-10-27 NOTE — Discharge Instructions (Addendum)
I have sent you in a muscle relaxer and anti-inflammatory that you can take for your injuries today.  If your shoulder continues to bother you, please call the orthopedic surgeon referral provided above to schedule an appointment for follow-up.  If your sciatic pain worsens, you can follow-up with your neurosurgeon.

## 2021-10-27 NOTE — ED Notes (Signed)
I provided reinforced discharge education based off of discharge summary/care provided. Pt acknowledged and understood my education. Pt had no further questions/concerns for provider/myself.   
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
## 2021-10-28 ENCOUNTER — Other Ambulatory Visit (HOSPITAL_COMMUNITY): Payer: Self-pay

## 2021-10-28 MED ORDER — METHYLPHENIDATE HCL ER (CD) 20 MG PO CPCR
ORAL_CAPSULE | ORAL | 0 refills | Status: DC
  Filled 2021-10-28: qty 30, 30d supply, fill #0

## 2021-10-31 ENCOUNTER — Other Ambulatory Visit (HOSPITAL_COMMUNITY): Payer: Self-pay

## 2021-11-15 ENCOUNTER — Other Ambulatory Visit (HOSPITAL_COMMUNITY): Payer: Self-pay

## 2021-11-17 ENCOUNTER — Other Ambulatory Visit (HOSPITAL_COMMUNITY): Payer: Self-pay

## 2021-11-17 MED ORDER — LANSOPRAZOLE 30 MG PO CPDR
30.0000 mg | DELAYED_RELEASE_CAPSULE | Freq: Every day | ORAL | 0 refills | Status: DC
  Filled 2021-11-17 – 2021-12-16 (×2): qty 90, 90d supply, fill #0

## 2021-11-21 ENCOUNTER — Other Ambulatory Visit (HOSPITAL_COMMUNITY): Payer: Self-pay

## 2021-12-06 ENCOUNTER — Other Ambulatory Visit (HOSPITAL_COMMUNITY): Payer: Self-pay

## 2021-12-13 ENCOUNTER — Other Ambulatory Visit (HOSPITAL_COMMUNITY): Payer: Self-pay

## 2021-12-15 ENCOUNTER — Other Ambulatory Visit (HOSPITAL_COMMUNITY): Payer: Self-pay

## 2021-12-16 ENCOUNTER — Other Ambulatory Visit (HOSPITAL_COMMUNITY): Payer: Self-pay

## 2021-12-17 ENCOUNTER — Other Ambulatory Visit (HOSPITAL_COMMUNITY): Payer: Self-pay

## 2021-12-17 MED ORDER — METHYLPHENIDATE HCL ER (CD) 20 MG PO CPCR
ORAL_CAPSULE | ORAL | 0 refills | Status: DC
Start: 2021-12-17 — End: 2023-07-02
  Filled 2021-12-17: qty 30, 30d supply, fill #0

## 2021-12-29 ENCOUNTER — Other Ambulatory Visit (HOSPITAL_COMMUNITY): Payer: Self-pay

## 2022-01-27 ENCOUNTER — Other Ambulatory Visit (HOSPITAL_COMMUNITY): Payer: Self-pay

## 2022-01-28 ENCOUNTER — Other Ambulatory Visit (HOSPITAL_COMMUNITY): Payer: Self-pay

## 2022-01-28 MED ORDER — METHYLPHENIDATE HCL ER (CD) 20 MG PO CPCR
20.0000 mg | ORAL_CAPSULE | Freq: Every day | ORAL | 0 refills | Status: DC
Start: 2022-01-28 — End: 2022-03-18
  Filled 2022-01-28: qty 30, 30d supply, fill #0

## 2022-01-29 ENCOUNTER — Other Ambulatory Visit (HOSPITAL_COMMUNITY): Payer: Self-pay

## 2022-01-30 ENCOUNTER — Other Ambulatory Visit (HOSPITAL_COMMUNITY): Payer: Self-pay

## 2022-02-05 ENCOUNTER — Other Ambulatory Visit (HOSPITAL_COMMUNITY): Payer: Self-pay

## 2022-02-06 ENCOUNTER — Other Ambulatory Visit (HOSPITAL_COMMUNITY): Payer: Self-pay

## 2022-02-09 ENCOUNTER — Other Ambulatory Visit (HOSPITAL_COMMUNITY): Payer: Self-pay

## 2022-02-12 ENCOUNTER — Other Ambulatory Visit (HOSPITAL_COMMUNITY): Payer: Self-pay

## 2022-02-13 ENCOUNTER — Other Ambulatory Visit (HOSPITAL_COMMUNITY): Payer: Self-pay

## 2022-02-17 ENCOUNTER — Other Ambulatory Visit (HOSPITAL_COMMUNITY): Payer: Self-pay

## 2022-02-19 ENCOUNTER — Other Ambulatory Visit (HOSPITAL_COMMUNITY): Payer: Self-pay

## 2022-02-19 MED ORDER — BUPROPION HCL ER (XL) 150 MG PO TB24
450.0000 mg | ORAL_TABLET | Freq: Every morning | ORAL | 0 refills | Status: DC
  Filled 2022-02-19 (×2): qty 270, 90d supply, fill #0

## 2022-02-19 MED ORDER — LAMOTRIGINE 200 MG PO TABS
200.0000 mg | ORAL_TABLET | Freq: Every day | ORAL | 0 refills | Status: DC
  Filled 2022-02-19: qty 90, 90d supply, fill #0

## 2022-02-20 ENCOUNTER — Other Ambulatory Visit (HOSPITAL_COMMUNITY): Payer: Self-pay

## 2022-02-23 ENCOUNTER — Other Ambulatory Visit (HOSPITAL_COMMUNITY): Payer: Self-pay

## 2022-03-18 ENCOUNTER — Other Ambulatory Visit (HOSPITAL_COMMUNITY): Payer: Self-pay

## 2022-03-18 MED ORDER — METHYLPHENIDATE HCL ER (CD) 20 MG PO CPCR
20.0000 mg | ORAL_CAPSULE | Freq: Every day | ORAL | 0 refills | Status: DC
  Filled 2022-03-18 – 2022-04-07 (×3): qty 30, 30d supply, fill #0

## 2022-03-31 ENCOUNTER — Other Ambulatory Visit (HOSPITAL_COMMUNITY): Payer: Self-pay

## 2022-04-07 ENCOUNTER — Other Ambulatory Visit (HOSPITAL_COMMUNITY): Payer: Self-pay

## 2022-04-08 ENCOUNTER — Other Ambulatory Visit: Payer: Self-pay

## 2022-04-15 ENCOUNTER — Other Ambulatory Visit (HOSPITAL_COMMUNITY): Payer: Self-pay

## 2022-04-15 MED ORDER — LANSOPRAZOLE 30 MG PO CPDR
30.0000 mg | DELAYED_RELEASE_CAPSULE | Freq: Every day | ORAL | 0 refills | Status: DC
  Filled 2022-04-15: qty 30, 30d supply, fill #0

## 2022-04-20 ENCOUNTER — Other Ambulatory Visit (HOSPITAL_COMMUNITY): Payer: Self-pay

## 2022-04-21 ENCOUNTER — Other Ambulatory Visit (HOSPITAL_COMMUNITY): Payer: Self-pay

## 2022-04-24 ENCOUNTER — Other Ambulatory Visit (HOSPITAL_COMMUNITY): Payer: Self-pay

## 2022-05-13 ENCOUNTER — Other Ambulatory Visit (HOSPITAL_COMMUNITY): Payer: Self-pay

## 2022-05-20 ENCOUNTER — Other Ambulatory Visit (HOSPITAL_COMMUNITY): Payer: Self-pay

## 2022-05-20 MED ORDER — METHYLPHENIDATE HCL ER (CD) 20 MG PO CPCR
20.0000 mg | ORAL_CAPSULE | Freq: Every day | ORAL | 0 refills | Status: DC
  Filled 2022-05-20: qty 30, 30d supply, fill #0

## 2022-05-21 ENCOUNTER — Other Ambulatory Visit (HOSPITAL_COMMUNITY): Payer: Self-pay

## 2022-05-26 ENCOUNTER — Other Ambulatory Visit (HOSPITAL_COMMUNITY): Payer: Self-pay

## 2022-06-04 ENCOUNTER — Other Ambulatory Visit (HOSPITAL_COMMUNITY): Payer: Self-pay

## 2022-06-11 ENCOUNTER — Other Ambulatory Visit (HOSPITAL_COMMUNITY): Payer: Self-pay

## 2022-06-12 ENCOUNTER — Other Ambulatory Visit (HOSPITAL_COMMUNITY): Payer: Self-pay

## 2022-06-12 MED ORDER — BUPROPION HCL ER (XL) 150 MG PO TB24
450.0000 mg | ORAL_TABLET | Freq: Every morning | ORAL | 0 refills | Status: DC
  Filled 2022-06-12: qty 270, 90d supply, fill #0

## 2022-06-12 MED ORDER — LAMOTRIGINE 200 MG PO TABS
ORAL_TABLET | ORAL | 0 refills | Status: DC
  Filled 2022-06-12: qty 90, 90d supply, fill #0

## 2022-07-01 ENCOUNTER — Other Ambulatory Visit (HOSPITAL_COMMUNITY): Payer: Self-pay

## 2022-07-02 ENCOUNTER — Other Ambulatory Visit (HOSPITAL_COMMUNITY): Payer: Self-pay

## 2022-07-02 MED ORDER — SAXENDA 18 MG/3ML ~~LOC~~ SOPN
3.0000 mg | PEN_INJECTOR | Freq: Every day | SUBCUTANEOUS | 0 refills | Status: DC
Start: 2022-07-01 — End: 2023-09-15
  Filled 2022-07-02: qty 15, 30d supply, fill #0

## 2022-07-06 ENCOUNTER — Other Ambulatory Visit (HOSPITAL_COMMUNITY): Payer: Self-pay

## 2022-07-06 DIAGNOSIS — Z0000 Encounter for general adult medical examination without abnormal findings: Secondary | ICD-10-CM | POA: Diagnosis not present

## 2022-07-06 DIAGNOSIS — F329 Major depressive disorder, single episode, unspecified: Secondary | ICD-10-CM | POA: Diagnosis not present

## 2022-07-06 DIAGNOSIS — F900 Attention-deficit hyperactivity disorder, predominantly inattentive type: Secondary | ICD-10-CM | POA: Diagnosis not present

## 2022-07-06 DIAGNOSIS — F902 Attention-deficit hyperactivity disorder, combined type: Secondary | ICD-10-CM | POA: Diagnosis not present

## 2022-07-06 DIAGNOSIS — Z23 Encounter for immunization: Secondary | ICD-10-CM | POA: Diagnosis not present

## 2022-07-06 DIAGNOSIS — Z1159 Encounter for screening for other viral diseases: Secondary | ICD-10-CM | POA: Diagnosis not present

## 2022-07-06 DIAGNOSIS — K219 Gastro-esophageal reflux disease without esophagitis: Secondary | ICD-10-CM | POA: Diagnosis not present

## 2022-07-06 DIAGNOSIS — G4730 Sleep apnea, unspecified: Secondary | ICD-10-CM | POA: Diagnosis not present

## 2022-07-06 DIAGNOSIS — E559 Vitamin D deficiency, unspecified: Secondary | ICD-10-CM | POA: Diagnosis not present

## 2022-07-06 DIAGNOSIS — Z79899 Other long term (current) drug therapy: Secondary | ICD-10-CM | POA: Diagnosis not present

## 2022-07-06 MED ORDER — METHYLPHENIDATE HCL ER (CD) 20 MG PO CPCR
20.0000 mg | ORAL_CAPSULE | Freq: Every day | ORAL | 0 refills | Status: DC
Start: 2022-07-06 — End: 2022-09-14
  Filled 2022-07-06: qty 90, 90d supply, fill #0

## 2022-07-06 MED ORDER — LANSOPRAZOLE 30 MG PO CPDR
30.0000 mg | DELAYED_RELEASE_CAPSULE | Freq: Every day | ORAL | 3 refills | Status: DC
  Filled 2022-07-06: qty 90, 90d supply, fill #0
  Filled 2022-10-06: qty 90, 90d supply, fill #1
  Filled 2023-05-26: qty 90, 90d supply, fill #2

## 2022-07-07 ENCOUNTER — Other Ambulatory Visit (HOSPITAL_COMMUNITY): Payer: Self-pay

## 2022-07-07 MED ORDER — VITAMIN D (ERGOCALCIFEROL) 50000 UNITS PO CAPS
50000.0000 [IU] | ORAL_CAPSULE | ORAL | 0 refills | Status: DC
  Filled 2022-07-07: qty 12, 84d supply, fill #0

## 2022-07-29 ENCOUNTER — Encounter: Payer: Self-pay | Admitting: Behavioral Health

## 2022-07-29 ENCOUNTER — Ambulatory Visit (INDEPENDENT_AMBULATORY_CARE_PROVIDER_SITE_OTHER): Payer: 59 | Admitting: Behavioral Health

## 2022-07-29 ENCOUNTER — Other Ambulatory Visit (HOSPITAL_COMMUNITY): Payer: Self-pay

## 2022-07-29 VITALS — BP 125/82 | HR 90 | Ht 70.0 in | Wt 345.0 lb

## 2022-07-29 DIAGNOSIS — F3181 Bipolar II disorder: Secondary | ICD-10-CM

## 2022-07-29 DIAGNOSIS — F411 Generalized anxiety disorder: Secondary | ICD-10-CM

## 2022-07-29 DIAGNOSIS — F902 Attention-deficit hyperactivity disorder, combined type: Secondary | ICD-10-CM | POA: Diagnosis not present

## 2022-07-29 MED ORDER — LAMOTRIGINE 200 MG PO TABS
ORAL_TABLET | ORAL | 0 refills | Status: DC
Start: 2022-07-29 — End: 2022-09-14
  Filled 2022-07-29: qty 90, fill #0
  Filled 2022-09-05 – 2022-09-07 (×2): qty 90, 90d supply, fill #0

## 2022-07-29 MED ORDER — BUPROPION HCL ER (XL) 150 MG PO TB24
450.0000 mg | ORAL_TABLET | Freq: Every morning | ORAL | 0 refills | Status: DC
Start: 2022-07-29 — End: 2023-05-20
  Filled 2022-07-29 – 2022-09-07 (×3): qty 270, 90d supply, fill #0

## 2022-07-29 MED ORDER — VILAZODONE HCL 20 MG PO TABS
20.0000 mg | ORAL_TABLET | Freq: Every day | ORAL | 1 refills | Status: DC
Start: 2022-07-29 — End: 2022-09-14
  Filled 2022-07-29: qty 30, 30d supply, fill #0
  Filled 2022-09-05 – 2022-09-07 (×2): qty 30, 30d supply, fill #1

## 2022-07-29 MED ORDER — LANSOPRAZOLE 30 MG PO CPDR
30.0000 mg | DELAYED_RELEASE_CAPSULE | Freq: Every day | ORAL | 11 refills | Status: AC
  Filled 2022-07-29: qty 90, fill #0
  Filled 2022-09-07 – 2023-01-09 (×3): qty 30, 30d supply, fill #0
  Filled 2023-02-11: qty 30, 30d supply, fill #1
  Filled 2023-03-11: qty 30, 30d supply, fill #2
  Filled 2023-04-21: qty 30, 30d supply, fill #3

## 2022-07-29 NOTE — Progress Notes (Signed)
[facility] MD/PA/NP Initial Note  07/29/2022 5:12 PM SABASTION HRDLICKA  MRN:  [id number]  Chief Complaint:  Chief Complaint   Anxiety; Depression; Manic Behavior; Patient Education; Follow-up; Family Problem     HPI:  "[name]", 37 year old male presents to this office for initial visit and to establish care.  His wife is present during interview with his verbal consent.  Collateral information should be considered reliable.  [facility]  He says that he was diagnosed with bipolar 2 disorder at the age of 70.  Says that currently he is mostly stable but still feels moderate level of depression and anxiety.  Acknowledges some relationship problems and lack of intimacy. Both he and his wife seeking therapy. His wife says that even though he has improved he still struggles with enjoying life or having the desire to do things.  Jonny Ruiz says that he is here today to find a new psychiatric provider that can manage his medications.  Says that he would like to review his medications today and possibly consider another medication for depression.  He is currently rating his depression today at 7/10, and anxiety at 5/10.  He is sleeping 7 to 8 hours per night.  Says that if it was not for responsibility to his family that he probably would not want to get out of bed most days.  He endorses fatigue, irritability, periods of hyperactivity, lack of concentration, and a history of gambling, and spending money and responsibly.  His PHQ-9 score was 18.  His MDQ was positive but states that he is not experienced most of the criteria on since he was on stimulants several years ago.  He is currently working full-time as a Surveyor, mining.  He is married with 3 children and lives in [place].  States that he has strong family support and that he feels safe.  Past psychiatric medication  trials: Lamictal Wellbutrin Methylphenidate Adderall Strattera Citalopram   Visit Diagnosis:    ICD-10-CM   1. Bipolar 2 disorder  F31.81 Vilazodone HCl 20 MG TABS    lamoTRIgine (LAMICTAL) 200 MG tablet    buPROPion (WELLBUTRIN XL) 150 MG 24 hr tablet    2. Generalized anxiety disorder  F41.1 Vilazodone HCl 20 MG TABS    buPROPion (WELLBUTRIN XL) 150 MG 24 hr tablet    3. Attention deficit hyperactivity disorder (ADHD), combined type  F90.2       Past Psychiatric History: Bipolar 2, MDD, Anxiety, ADHD combined type  Past Medical History:  Past Medical History:  Diagnosis Date   Anxiety    Back pain    Bipolar 1 disorder    Depression    High blood pressure    Knee pain    Low testosterone     Past Surgical History:  Procedure Laterality Date   ROTATOR CUFF REPAIR Right    TESTICLE SURGERY     childhood accident   TONSILLECTOMY      Family Psychiatric History: see chart  Family History:  Family History  Problem Relation Age of Onset   Hypertension Father    Cancer Father    Bipolar disorder Father    Depression Father    Obesity Father    Anxiety disorder Sister    Depression Sister    OCD Sister    Drug abuse Brother    Stroke Maternal Grandfather    Hypertension Paternal Grandfather     Social History:  Social History  Socioeconomic History   Marital status: Married    Spouse name: Shine Mikes   Number of children: 3   Years of education: 14   Highest education level: Associate degree: occupational, Scientist, product/process development, or vocational program  Occupational History   Occupation: MHT    Employer: [employer]  Tobacco Use   Smoking status: Former    Years: 6    Types: Cigarettes    Quit date: 2016    Years since quitting: 8.3   Smokeless tobacco: Never  Vaping Use   Vaping Use: Never used  Substance and Sexual Activity   Alcohol use: No    Comment: light social   Drug use: No   Sexual activity: Not Currently    Partners: Female  Other  Topics Concern   Not on file  Social History Narrative   Lives in [place] with wife and three children. Enjoys playing and watching sports. Has had previous problems with gambling and spending money irresponsibly.    Social Determinants of Health   Financial Resource Strain: Not on file  Food Insecurity: Not on file  Transportation Needs: Not on file  Physical Activity: Not on file  Stress: Not on file  Social Connections: Not on file    Allergies:  Allergies  Allergen Reactions   Erythromycin Nausea And Vomiting    Metabolic Disorder Labs: Lab Results  Component Value Date   HGBA1C 5.5 11/08/2017   No results found for: "PROLACTIN" Lab Results  Component Value Date   CHOL 136 06/12/2011   TRIG 108.0 06/12/2011   HDL 31.40 (L) 06/12/2011   CHOLHDL 4 06/12/2011   VLDL 21.6 06/12/2011   LDLCALC 83 06/12/2011   Lab Results  Component Value Date   TSH 1.580 10/25/2017   TSH 1.09 06/12/2011    Therapeutic Level Labs: No results found for: "LITHIUM" No results found for: "VALPROATE" No results found for: "CBMZ"  Current Medications: Current Outpatient Medications  Medication Sig Dispense Refill   Vilazodone HCl 20 MG TABS Take 1 tablet (20 mg total) by mouth daily after breakfast. 30 tablet 1   buPROPion (WELLBUTRIN XL) 150 MG 24 hr tablet Take 150 mg by mouth daily.     buPROPion (WELLBUTRIN XL) 150 MG 24 hr tablet TAKE 3 TABLETS BY MOUTH EVERY MORNING 270 tablet 1   buPROPion (WELLBUTRIN XL) 150 MG 24 hr tablet TAKE 3 TABLETS BY MOUTH EVERY MORNING 270 tablet 1   buPROPion (WELLBUTRIN XL) 150 MG 24 hr tablet Take 3 tablets by mouth every morning 270 tablet 1   buPROPion (WELLBUTRIN XL) 150 MG 24 hr tablet Take 3 tablets by mouth every morning. 270 tablet 1   buPROPion (WELLBUTRIN XL) 150 MG 24 hr tablet Take 3 tablets (450 mg total) by mouth in the morning. 270 tablet 0   buPROPion (WELLBUTRIN XL) 150 MG 24 hr tablet Take 3 tablets (450 mg total) by mouth in  the morning. 270 tablet 0   cyclobenzaprine (FLEXERIL) 10 MG tablet Take 1 tablet by mouth 2 times daily as needed for muscle spasms. 20 tablet 0   gabapentin (NEURONTIN) 300 MG capsule Take 1 capsule by mouth at bedtime. 15 capsule 0   HYDROcodone-acetaminophen (NORCO) 10-325 MG tablet Take 1 tablet by mouth every six to eight hours 15 tablet 0   Insulin Pen Needle (UNIFINE PENTIPS) 32G X 4 MM MISC Use as directed to inject with Saxenda 100 each 4   ketorolac (TORADOL) 10 MG tablet Take 1 tablet  by mouth  every 6  hours as needed. 20 tablet 0   lamoTRIgine (LAMICTAL) 100 MG tablet Take 1 tablet (100 mg total) by mouth daily. (Patient taking differently: Take 75 mg by mouth daily.) 30 tablet 3   lamoTRIgine (LAMICTAL) 200 MG tablet TAKE 1 TABLET BY MOUTH AT BEDTIME (IF OUT OF MEDICATION MORE THAN 1 WEEK DO NOT RESTART, CALL OFFICE) (IF RASH DEVELOPS, STOP AND CALL OFFICE 90 tablet 1   lamoTRIgine (LAMICTAL) 200 MG tablet TAKE 1 TABLET BY MOUTH EVERY NIGHT AT BEDTIME. IF OUT OF LAMICTAL MORE THAN 1 WEEK DO NOT RESTART, CALL OFFICE. IF RASH DEVELOPS, STOP MED AND CALL MD 90 tablet 1   lamoTRIgine (LAMICTAL) 200 MG tablet Take 1 tablet by mouth at bedtime (if out of lamictal more than 1 week do not restart, call office)(if rash develops, stop med and call office) 90 tablet 1   lamoTRIgine (LAMICTAL) 200 MG tablet Take 1 tablet by mouth at bedtime (if out of lamictal more than 1 wk do not restart, call office)(if rash develops, stop med and call office) 90 tablet 1   lamoTRIgine (LAMICTAL) 200 MG tablet Take 1 tablet (200 mg total) by mouth at bedtime.  (if out of lamictal more than 1 week do not restart, call office)(if rash develops, stop med and call office) 90 tablet 0   lamoTRIgine (LAMICTAL) 200 MG tablet Take 1 tablet by mouth at bedtime (if out of lamictal more than 1 week do not restart, call office)(if rash develops, stop med and call office) 90 tablet 0   lansoprazole (PREVACID) 30 MG capsule       lansoprazole (PREVACID) 30 MG capsule Take 1 capsule (30 mg total) by mouth daily. 90 capsule 3   lansoprazole (PREVACID) 30 MG capsule TAKE 1 CAPSULE BY MOUTH ONCE A DAY 90 capsule 3   Liraglutide -Weight Management (SAXENDA) 18 MG/3ML SOPN Inject 0.6 mg once daily for a week, then increase to 1.2 mg week two, 1.8 mg week three, 2.4 mg week four, and finally 3 mg as tolerated. 15 mL 8   Liraglutide -Weight Management (SAXENDA) 18 MG/3ML SOPN Inject 3 mg into the skin once a day 15 mL 8   Liraglutide -Weight Management (SAXENDA) 18 MG/3ML SOPN Inject 3 mg into the skin daily. 15 mL 0   methylphenidate (METADATE CD) 20 MG CR capsule Take 1 capsule by mouth daily 30 capsule 0   methylphenidate (METADATE CD) 20 MG CR capsule Take 1 capsule (20 mg total) by mouth daily. 90 capsule 0   predniSONE (DELTASONE) 20 MG tablet Take 3 tablets every morning for 2 days, then 2 tablets every morning for 2 days, then 1 tablet every morning for 2 days. 12 tablet 0   Vitamin D, Ergocalciferol, 50000 units CAPS Take 50,000 Units (1 capsule) by mouth once a week. 12 capsule 0   No current facility-administered medications for this visit.    Medication Side Effects: none  Orders placed this visit:  No orders of the defined types were placed in this encounter.   Psychiatric Specialty Exam:  Review of Systems  Constitutional: Negative.   Musculoskeletal:  Positive for back pain.  Allergic/Immunologic: Negative.   Psychiatric/Behavioral:  Positive for dysphoric mood. The patient is nervous/anxious.     Blood pressure 125/82, pulse 90, height  (1.778 m), weight (!) 345 lb (156.5 kg).Body mass index is 49.5 kg/m.  General Appearance: Casual, Neat, and Well Groomed  Eye Contact:  Good  Speech:  Clear and  Coherent  Volume:  Normal  Mood:  Anxious  Affect:  Non-Congruent, Depressed, Restricted, and Anxious  Thought Process:  Coherent  Orientation:  Full (Time, Place, and Person)  Thought Content:  Logical   Suicidal Thoughts:  No  Homicidal Thoughts:  No  Memory:  WNL  Judgement:  Fair  Insight:  Fair  Psychomotor Activity:  Normal  Concentration:  Concentration: Fair  Recall:  Good  Fund of Knowledge: Fair  Language: Good  Assets:  Desire for Improvement Intimacy Leisure Time Physical Health Resilience  ADL's:  Intact  Cognition: WNL  Prognosis:  Good   Screenings:  PHQ2-9    Flowsheet Row Office Visit from 07/29/2022 in [facility] Office Visit from 10/25/2017 in [facility] Total Score 5 2  PHQ-9 Total Score 18 13      Flowsheet Row ED from 10/27/2021 in [facility] Emergency Department at [facility] ED from 08/01/2021 in [facility] at [facility] ED from 03/13/2021 in [facility] Emergency Department at [facility]  C-SSRS RISK CATEGORY No Risk No Risk No Risk       Receiving Psychotherapy: No Seeking new therapist  Treatment Plan/Recommendations:   Greater than 50% of face to face time with patient was spent on counseling and coordination of care. We discussed his previous psychiatric care with [facility]. Discussed previous and current medications. Reviewed medication and talked about goals for tx here. We talked about his family dynamics and need for therapy.  We agreed today to: To continue Wellbutrin 450 mg XL daily. Pt taking three 150 mg tablets per day at same time.  To continue Lamictal 200 mg To start Viibryd 10 mg daily for 7 days, then 20 mg daily.  Will report side effects of worsening symptoms promptly To follow-up in 4 weeks to reassess Provided emergency contact information Reviewed PDMP   Joan Flores, NP

## 2022-08-05 ENCOUNTER — Other Ambulatory Visit (HOSPITAL_COMMUNITY): Payer: Self-pay
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
## 2022-08-26 ENCOUNTER — Ambulatory Visit (INDEPENDENT_AMBULATORY_CARE_PROVIDER_SITE_OTHER): Payer: 59 | Admitting: Mental Health

## 2022-08-26 DIAGNOSIS — F3181 Bipolar II disorder: Secondary | ICD-10-CM | POA: Diagnosis not present
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
## 2022-09-05 ENCOUNTER — Other Ambulatory Visit (HOSPITAL_COMMUNITY): Payer: Self-pay

## 2022-09-07 ENCOUNTER — Other Ambulatory Visit: Payer: Self-pay

## 2022-09-07 ENCOUNTER — Other Ambulatory Visit (HOSPITAL_COMMUNITY): Payer: Self-pay

## 2022-09-14 ENCOUNTER — Other Ambulatory Visit (HOSPITAL_COMMUNITY): Payer: Self-pay

## 2022-09-14 ENCOUNTER — Ambulatory Visit (INDEPENDENT_AMBULATORY_CARE_PROVIDER_SITE_OTHER): Payer: 59 | Admitting: Behavioral Health

## 2022-09-14 ENCOUNTER — Encounter: Payer: Self-pay | Admitting: Behavioral Health

## 2022-09-14 DIAGNOSIS — F3181 Bipolar II disorder: Secondary | ICD-10-CM

## 2022-09-14 DIAGNOSIS — F902 Attention-deficit hyperactivity disorder, combined type: Secondary | ICD-10-CM | POA: Diagnosis not present

## 2022-09-14 DIAGNOSIS — F411 Generalized anxiety disorder: Secondary | ICD-10-CM

## 2022-09-14 MED ORDER — METHYLPHENIDATE HCL ER (CD) 20 MG PO CPCR
20.0000 mg | ORAL_CAPSULE | Freq: Every day | ORAL | 0 refills | Status: DC
Start: 2022-09-14 — End: 2022-11-02
  Filled 2022-09-14 – 2022-10-06 (×2): qty 30, 30d supply, fill #0

## 2022-09-14 MED ORDER — VILAZODONE HCL 20 MG PO TABS
20.0000 mg | ORAL_TABLET | Freq: Every day | ORAL | 3 refills | Status: DC
Start: 2022-09-14 — End: 2022-11-09
  Filled 2022-09-14 – 2022-10-06 (×2): qty 30, 30d supply, fill #0
  Filled 2022-11-02: qty 30, 30d supply, fill #1

## 2022-09-14 MED ORDER — LAMOTRIGINE 200 MG PO TABS
ORAL_TABLET | ORAL | 1 refills | Status: DC
Start: 2022-09-14 — End: 2022-11-09
  Filled 2022-09-14: qty 90, fill #0

## 2022-09-14 MED ORDER — BUPROPION HCL ER (XL) 150 MG PO TB24
450.0000 mg | ORAL_TABLET | Freq: Every morning | ORAL | 1 refills | Status: DC
Start: 2022-09-14 — End: 2023-08-18
  Filled 2022-09-14 – 2022-12-08 (×3): qty 270, 90d supply, fill #0

## 2022-09-14 NOTE — Progress Notes (Signed)
[facility] Med Check  Patient ID: Stephen Hartman,  MRN: [id number]  PCP: Maurice Small, MD (Inactive)  Date of Evaluation: 09/14/2022 Time spent:30 minutes  Chief Complaint:  Chief Complaint   Anxiety; Depression; Follow-up; Medication Refill; Patient Education     HISTORY/CURRENT STATUS: HPI  "Stephen Hartman", 37 year old male presents to this office for follow up and medication management.  Says he is feeling much better since starting the Viibryd. He has had no recent SI. He does not want to adjust medications this visit.   He is currently rating his depression today at 3/10, and anxiety at 3/10.  He is sleeping 7 to 8 hours per night.    States that he has strong family support and that he feels safe. No mania, no psychosis, no auditory or visual hallucinations. No SI or HI    Past psychiatric medication trials: Lamictal Wellbutrin Methylphenidate Adderall Strattera Citalopram     Individual Medical History/ Review of Systems: Changes? :No   Allergies: Erythromycin  Current Medications:  Current Outpatient Medications:    buPROPion (WELLBUTRIN XL) 150 MG 24 hr tablet, Take 150 mg by mouth daily., Disp: , Rfl:    buPROPion (WELLBUTRIN XL) 150 MG 24 hr tablet, TAKE 3 TABLETS BY MOUTH EVERY MORNING, Disp: 270 tablet, Rfl: 1   buPROPion (WELLBUTRIN XL) 150 MG 24 hr tablet, TAKE 3 TABLETS BY MOUTH EVERY MORNING, Disp: 270 tablet, Rfl: 1   buPROPion (WELLBUTRIN XL) 150 MG 24 hr tablet, Take 3 tablets by mouth every morning, Disp: 270 tablet, Rfl: 1   buPROPion (WELLBUTRIN XL) 150 MG 24 hr tablet, Take 3 tablets by mouth every morning., Disp: 270 tablet, Rfl: 1   buPROPion (WELLBUTRIN XL) 150 MG 24 hr tablet, Take 3 tablets (450 mg total) by mouth in the morning., Disp: 270 tablet, Rfl: 0   buPROPion (WELLBUTRIN XL) 150 MG 24 hr tablet, Take 3 tablets (450 mg total) by mouth in the morning., Disp: 270 tablet, Rfl: 1   cyclobenzaprine (FLEXERIL) 10 MG tablet, Take 1 tablet by  mouth 2 times daily as needed for muscle spasms., Disp: 20 tablet, Rfl: 0   gabapentin (NEURONTIN) 300 MG capsule, Take 1 capsule by mouth at bedtime., Disp: 15 capsule, Rfl: 0   HYDROcodone-acetaminophen (NORCO) 10-325 MG tablet, Take 1 tablet by mouth every six to eight hours, Disp: 15 tablet, Rfl: 0   Insulin Pen Needle (UNIFINE PENTIPS) 32G X 4 MM MISC, Use as directed to inject with Saxenda, Disp: 100 each, Rfl: 4   ketorolac (TORADOL) 10 MG tablet, Take 1 tablet  by mouth every 6  hours as needed., Disp: 20 tablet, Rfl: 0   lamoTRIgine (LAMICTAL) 100 MG tablet, Take 1 tablet (100 mg total) by mouth daily. (Patient taking differently: Take 75 mg by mouth daily.), Disp: 30 tablet, Rfl: 3   lamoTRIgine (LAMICTAL) 200 MG tablet, TAKE 1 TABLET BY MOUTH AT BEDTIME (IF OUT OF MEDICATION MORE THAN 1 WEEK DO NOT RESTART, CALL OFFICE) (IF RASH DEVELOPS, STOP AND CALL OFFICE, Disp: 90 tablet, Rfl: 1   lamoTRIgine (LAMICTAL) 200 MG tablet, TAKE 1 TABLET BY MOUTH EVERY NIGHT AT BEDTIME. IF OUT OF LAMICTAL MORE THAN 1 WEEK DO NOT RESTART, CALL OFFICE. IF RASH DEVELOPS, STOP MED AND CALL MD, Disp: 90 tablet, Rfl: 1   lamoTRIgine (LAMICTAL) 200 MG tablet, Take 1 tablet by mouth at bedtime (if out of lamictal more than 1 week do not restart, call office)(if rash develops, stop med and call office), Disp: 90  tablet, Rfl: 1   lamoTRIgine (LAMICTAL) 200 MG tablet, Take 1 tablet by mouth at bedtime (if out of lamictal more than 1 wk do not restart, call office)(if rash develops, stop med and call office), Disp: 90 tablet, Rfl: 1   lamoTRIgine (LAMICTAL) 200 MG tablet, Take 1 tablet (200 mg total) by mouth at bedtime.  (if out of lamictal more than 1 week do not restart, call office)(if rash develops, stop med and call office), Disp: 90 tablet, Rfl: 0   lamoTRIgine (LAMICTAL) 200 MG tablet, Take 1 tablet by mouth at bedtime (if out of lamictal more than 1 week do not restart, call office)(if rash develops, stop med and  call office), Disp: 90 tablet, Rfl: 1   lansoprazole (PREVACID) 30 MG capsule, , Disp: , Rfl:    lansoprazole (PREVACID) 30 MG capsule, Take 1 capsule (30 mg total) by mouth daily., Disp: 90 capsule, Rfl: 3   lansoprazole (PREVACID) 30 MG capsule, Take 1 capsule (30 mg total) by mouth daily., Disp: 30 capsule, Rfl: 11   Liraglutide -Weight Management (SAXENDA) 18 MG/3ML SOPN, Inject 0.6 mg once daily for a week, then increase to 1.2 mg week two, 1.8 mg week three, 2.4 mg week four, and finally 3 mg as tolerated., Disp: 15 mL, Rfl: 8   Liraglutide -Weight Management (SAXENDA) 18 MG/3ML SOPN, Inject 3 mg into the skin once a day, Disp: 15 mL, Rfl: 8   Liraglutide -Weight Management (SAXENDA) 18 MG/3ML SOPN, Inject 3 mg into the skin daily., Disp: 15 mL, Rfl: 0   methylphenidate (METADATE CD) 20 MG CR capsule, Take 1 capsule by mouth daily, Disp: 30 capsule, Rfl: 0   methylphenidate (METADATE CD) 20 MG CR capsule, Take 1 capsule (20 mg total) by mouth daily., Disp: 30 capsule, Rfl: 0   predniSONE (DELTASONE) 20 MG tablet, Take 3 tablets every morning for 2 days, then 2 tablets every morning for 2 days, then 1 tablet every morning for 2 days., Disp: 12 tablet, Rfl: 0   Vilazodone HCl 20 MG TABS, Take 1 tablet (20 mg total) by mouth daily after breakfast., Disp: 30 tablet, Rfl: 3   Vitamin D, Ergocalciferol, 50000 units CAPS, Take 50,000 Units (1 capsule) by mouth once a week., Disp: 12 capsule, Rfl: 0 Medication Side Effects: none  Family Medical/ Social History: Changes? No  MENTAL HEALTH EXAM:  There were no vitals taken for this visit.There is no height or weight on file to calculate BMI.  General Appearance: Casual, Neat, and Well Groomed  Eye Contact:  Good  Speech:  Clear and Coherent  Volume:  Normal  Mood:  Anxious  Affect:  Appropriate  Thought Process:  Coherent  Orientation:  Full (Time, Place, and Person)  Thought Content: Logical   Suicidal Thoughts:  No  Homicidal Thoughts:  No   Memory:  WNL  Judgement:  Good  Insight:  Good  Psychomotor Activity:  Normal  Concentration:  Concentration: Good  Recall:  Good  Fund of Knowledge: Good  Language: Good  Assets:  Desire for Improvement  ADL's:  Intact  Cognition: WNL  Prognosis:  Good    DIAGNOSES:    ICD-10-CM   1. Bipolar 2 disorder (HCC)  F31.81 lamoTRIgine (LAMICTAL) 200 MG tablet    Vilazodone HCl 20 MG TABS    buPROPion (WELLBUTRIN XL) 150 MG 24 hr tablet    2. Attention deficit hyperactivity disorder (ADHD), combined type  F90.2 methylphenidate (METADATE CD) 20 MG CR capsule  3. Generalized anxiety disorder  F41.1 Vilazodone HCl 20 MG TABS    buPROPion (WELLBUTRIN XL) 150 MG 24 hr tablet      Receiving Psychotherapy: No    RECOMMENDATIONS:  Greater than 50% of face to face time with patient was spent on counseling and coordination of care. We discussed his report of significant improvement. He likes his medication and is requesting no changes today.  We agreed today to: To continue Wellbutrin 450 mg XL daily. Pt taking three 150 mg tablets per day at same time.  To continue Lamictal 200 mg To continue Viibryd 20 mg daily.  Will report side effects of worsening symptoms promptly To follow-up in 8 weeks to reassess Provided emergency contact information Reviewed PDMP   Joan Flores, NP

## 2022-09-23 ENCOUNTER — Ambulatory Visit: Payer: 59 | Admitting: Behavioral Health

## 2022-09-28 ENCOUNTER — Other Ambulatory Visit (HOSPITAL_COMMUNITY): Payer: Self-pay

## 2022-10-05 ENCOUNTER — Ambulatory Visit (INDEPENDENT_AMBULATORY_CARE_PROVIDER_SITE_OTHER): Payer: Self-pay | Admitting: Mental Health

## 2022-10-05 DIAGNOSIS — Z0389 Encounter for observation for other suspected diseases and conditions ruled out: Secondary | ICD-10-CM

## 2022-10-05 NOTE — Progress Notes (Signed)
No show charge for 10/05/22 missed session.

## 2022-10-06 ENCOUNTER — Other Ambulatory Visit (HOSPITAL_COMMUNITY): Payer: Self-pay

## 2022-10-21 ENCOUNTER — Ambulatory Visit: Payer: 59 | Admitting: Mental Health

## 2022-11-02 ENCOUNTER — Other Ambulatory Visit: Payer: Self-pay | Admitting: Behavioral Health

## 2022-11-02 ENCOUNTER — Other Ambulatory Visit (HOSPITAL_COMMUNITY): Payer: Self-pay

## 2022-11-02 ENCOUNTER — Ambulatory Visit: Payer: 59 | Admitting: Mental Health

## 2022-11-02 DIAGNOSIS — F902 Attention-deficit hyperactivity disorder, combined type: Secondary | ICD-10-CM

## 2022-11-02 NOTE — Telephone Encounter (Signed)
LF 7/2, due 7/30

## 2022-11-03 ENCOUNTER — Other Ambulatory Visit (HOSPITAL_COMMUNITY): Payer: Self-pay

## 2022-11-03 MED ORDER — METHYLPHENIDATE HCL ER (CD) 20 MG PO CPCR
20.0000 mg | ORAL_CAPSULE | Freq: Every day | ORAL | 0 refills | Status: DC
Start: 2022-11-03 — End: 2022-12-08
  Filled 2022-11-03: qty 11, 11d supply, fill #0
  Filled 2022-11-03: qty 19, 19d supply, fill #0

## 2022-11-06 ENCOUNTER — Other Ambulatory Visit (HOSPITAL_COMMUNITY): Payer: Self-pay

## 2022-11-09 ENCOUNTER — Other Ambulatory Visit (HOSPITAL_COMMUNITY): Payer: Self-pay

## 2022-11-09 ENCOUNTER — Encounter: Payer: Self-pay | Admitting: Behavioral Health

## 2022-11-09 ENCOUNTER — Ambulatory Visit (INDEPENDENT_AMBULATORY_CARE_PROVIDER_SITE_OTHER): Payer: 59 | Admitting: Behavioral Health

## 2022-11-09 DIAGNOSIS — F411 Generalized anxiety disorder: Secondary | ICD-10-CM | POA: Diagnosis not present

## 2022-11-09 DIAGNOSIS — F3181 Bipolar II disorder: Secondary | ICD-10-CM

## 2022-11-09 DIAGNOSIS — F902 Attention-deficit hyperactivity disorder, combined type: Secondary | ICD-10-CM

## 2022-11-09 MED ORDER — VILAZODONE HCL 20 MG PO TABS
20.0000 mg | ORAL_TABLET | Freq: Every day | ORAL | 3 refills | Status: DC
Start: 2022-11-09 — End: 2023-04-21
  Filled 2022-11-09 – 2022-12-08 (×3): qty 30, 30d supply, fill #0
  Filled 2023-01-09 (×2): qty 30, 30d supply, fill #1
  Filled 2023-02-11: qty 30, 30d supply, fill #2
  Filled 2023-03-11: qty 30, 30d supply, fill #3

## 2022-11-09 MED ORDER — BUPROPION HCL ER (XL) 150 MG PO TB24
ORAL_TABLET | ORAL | 1 refills | Status: DC
Start: 2022-11-09 — End: 2023-08-18
  Filled 2022-11-09 – 2023-03-11 (×2): qty 270, 90d supply, fill #0

## 2022-11-09 MED ORDER — LAMOTRIGINE 200 MG PO TABS
200.0000 mg | ORAL_TABLET | Freq: Every evening | ORAL | 1 refills | Status: DC
Start: 2022-11-09 — End: 2023-05-20
  Filled 2022-11-09: qty 90, fill #0
  Filled 2022-12-08 (×2): qty 90, 90d supply, fill #0
  Filled 2023-03-11: qty 90, 90d supply, fill #1

## 2022-11-09 NOTE — Progress Notes (Signed)
[facility]  Patient ID: Stephen Hartman,  MRN: [id number]  PCP: Maurice Small, MD (Inactive)  Date of Evaluation: 11/09/2022 Time spent:30 minutes  Chief Complaint:  Chief Complaint   Anxiety; Depression; Medication Reaction; Patient Education; Medication Refill        HISTORY/CURRENT STATUS: HPI "Stephen Hartman", 37 year old male presents to this office for follow up and medication management.  Says he is feeling much better since starting the Viibryd. He has had no recent SI. He does not want to adjust medications this visit.   He is currently rating his depression today at 3/10, and anxiety at 3/10.  He is sleeping 7 to 8 hours per night.    States that he has strong family support and that he feels safe. No mania, no psychosis, no auditory or visual hallucinations. No SI or HI    Past psychiatric medication trials: Lamictal Wellbutrin Methylphenidate Adderall Strattera Citalopram      Individual Medical History/ Review of Systems: Changes? :No   Allergies: Erythromycin  Current Medications:  Current Outpatient Medications:    buPROPion (WELLBUTRIN XL) 150 MG 24 hr tablet, Take 150 mg by mouth daily., Disp: , Rfl:    buPROPion (WELLBUTRIN XL) 150 MG 24 hr tablet, TAKE 3 TABLETS BY MOUTH EVERY MORNING, Disp: 270 tablet, Rfl: 1   buPROPion (WELLBUTRIN XL) 150 MG 24 hr tablet, TAKE 3 TABLETS BY MOUTH EVERY MORNING, Disp: 270 tablet, Rfl: 1   buPROPion (WELLBUTRIN XL) 150 MG 24 hr tablet, Take 3 tablets by mouth every morning, Disp: 270 tablet, Rfl: 1   buPROPion (WELLBUTRIN XL) 150 MG 24 hr tablet, Take 3 tablets by mouth every morning., Disp: 270 tablet, Rfl: 1   buPROPion (WELLBUTRIN XL) 150 MG 24 hr tablet, Take 3 tablets (450 mg total) by mouth in the morning., Disp: 270 tablet, Rfl: 0   buPROPion (WELLBUTRIN XL) 150 MG 24 hr tablet, Take 3 tablets (450 mg total) by mouth in the morning., Disp: 270 tablet, Rfl: 1   cyclobenzaprine (FLEXERIL) 10 MG tablet, Take 1  tablet by mouth 2 times daily as needed for muscle spasms., Disp: 20 tablet, Rfl: 0   gabapentin (NEURONTIN) 300 MG capsule, Take 1 capsule by mouth at bedtime., Disp: 15 capsule, Rfl: 0   HYDROcodone-acetaminophen (NORCO) 10-325 MG tablet, Take 1 tablet by mouth every six to eight hours, Disp: 15 tablet, Rfl: 0   Insulin Pen Needle (UNIFINE PENTIPS) 32G X 4 MM MISC, Use as directed to inject with Saxenda, Disp: 100 each, Rfl: 4   ketorolac (TORADOL) 10 MG tablet, Take 1 tablet  by mouth every 6  hours as needed., Disp: 20 tablet, Rfl: 0   lamoTRIgine (LAMICTAL) 100 MG tablet, Take 1 tablet (100 mg total) by mouth daily. (Patient taking differently: Take 75 mg by mouth daily.), Disp: 30 tablet, Rfl: 3   lamoTRIgine (LAMICTAL) 200 MG tablet, TAKE 1 TABLET BY MOUTH AT BEDTIME (IF OUT OF MEDICATION MORE THAN 1 WEEK DO NOT RESTART, CALL OFFICE) (IF RASH DEVELOPS, STOP AND CALL OFFICE, Disp: 90 tablet, Rfl: 1   lamoTRIgine (LAMICTAL) 200 MG tablet, TAKE 1 TABLET BY MOUTH EVERY NIGHT AT BEDTIME. IF OUT OF LAMICTAL MORE THAN 1 WEEK DO NOT RESTART, CALL OFFICE. IF RASH DEVELOPS, STOP MED AND CALL MD, Disp: 90 tablet, Rfl: 1   lamoTRIgine (LAMICTAL) 200 MG tablet, Take 1 tablet by mouth at bedtime (if out of lamictal more than 1 week do not restart, call office)(if rash develops, stop med and  call office), Disp: 90 tablet, Rfl: 1   lamoTRIgine (LAMICTAL) 200 MG tablet, Take 1 tablet by mouth at bedtime (if out of lamictal more than 1 wk do not restart, call office)(if rash develops, stop med and call office), Disp: 90 tablet, Rfl: 1   lamoTRIgine (LAMICTAL) 200 MG tablet, Take 1 tablet (200 mg total) by mouth at bedtime.  (if out of lamictal more than 1 week do not restart, call office)(if rash develops, stop med and call office), Disp: 90 tablet, Rfl: 0   lamoTRIgine (LAMICTAL) 200 MG tablet, Take 1 tablet by mouth at bedtime (if out of lamictal more than 1 week do not restart, call office)(if rash develops, stop  med and call office), Disp: 90 tablet, Rfl: 1   lansoprazole (PREVACID) 30 MG capsule, , Disp: , Rfl:    lansoprazole (PREVACID) 30 MG capsule, Take 1 capsule (30 mg total) by mouth daily., Disp: 90 capsule, Rfl: 3   lansoprazole (PREVACID) 30 MG capsule, Take 1 capsule (30 mg total) by mouth daily., Disp: 30 capsule, Rfl: 11   Liraglutide -Weight Management (SAXENDA) 18 MG/3ML SOPN, Inject 0.6 mg once daily for a week, then increase to 1.2 mg week two, 1.8 mg week three, 2.4 mg week four, and finally 3 mg as tolerated., Disp: 15 mL, Rfl: 8   Liraglutide -Weight Management (SAXENDA) 18 MG/3ML SOPN, Inject 3 mg into the skin once a day, Disp: 15 mL, Rfl: 8   Liraglutide -Weight Management (SAXENDA) 18 MG/3ML SOPN, Inject 3 mg into the skin daily., Disp: 15 mL, Rfl: 0   methylphenidate (METADATE CD) 20 MG CR capsule, Take 1 capsule by mouth daily, Disp: 30 capsule, Rfl: 0   methylphenidate (METADATE CD) 20 MG CR capsule, Take 1 capsule (20 mg total) by mouth daily., Disp: 30 capsule, Rfl: 0   predniSONE (DELTASONE) 20 MG tablet, Take 3 tablets every morning for 2 days, then 2 tablets every morning for 2 days, then 1 tablet every morning for 2 days., Disp: 12 tablet, Rfl: 0   Vilazodone HCl 20 MG TABS, Take 1 tablet (20 mg total) by mouth daily after breakfast., Disp: 30 tablet, Rfl: 3   Vitamin D, Ergocalciferol, 50000 units CAPS, Take 50,000 Units (1 capsule) by mouth once a week., Disp: 12 capsule, Rfl: 0 Medication Side Effects: none  Family Medical/ Social History: Changes? No  MENTAL HEALTH EXAM:  There were no vitals taken for this visit.There is no height or weight on file to calculate BMI.  General Appearance: Casual, Neat, and Well Groomed  Eye Contact:  Good  Speech:  Clear and Coherent  Volume:  Normal  Mood:  NA  Affect:  Appropriate  Thought Process:  Coherent  Orientation:  Full (Time, Place, and Person)  Thought Content: Logical   Suicidal Thoughts:  No  Homicidal Thoughts:   No  Memory:  WNL  Judgement:  Good  Insight:  Good  Psychomotor Activity:  Normal  Concentration:  Concentration: Good  Recall:  Good  Fund of Knowledge: Good  Language: Good  Assets:  Desire for Improvement  ADL's:  Intact  Cognition: WNL  Prognosis:  Good    DIAGNOSES:    ICD-10-CM   1. Bipolar 2 disorder (HCC)  F31.81     2. Attention deficit hyperactivity disorder (ADHD), combined type  F90.2     3. Generalized anxiety disorder  F41.1       Receiving Psychotherapy: No    RECOMMENDATIONS:    Greater than 50%  of face to face time with patient was spent on counseling and coordination of care. We discussed his report of significant improvement. He likes his medication and is requesting no changes today.  We agreed today to: To continue Wellbutrin 450 mg XL daily. Pt taking three 150 mg tablets per day at same time.  To continue Lamictal 200 mg To continue Viibryd 20 mg daily.  Will report side effects of worsening symptoms promptly To follow-up in 8 weeks to reassess Provided emergency contact information Reviewed PDMP        Joan Flores, NP

## 2022-11-10 ENCOUNTER — Other Ambulatory Visit (HOSPITAL_COMMUNITY): Payer: Self-pay

## 2022-11-19 ENCOUNTER — Ambulatory Visit (INDEPENDENT_AMBULATORY_CARE_PROVIDER_SITE_OTHER): Payer: 59 | Admitting: Mental Health

## 2022-11-19 DIAGNOSIS — F3181 Bipolar II disorder: Secondary | ICD-10-CM | POA: Diagnosis not present

## 2022-11-19 NOTE — Progress Notes (Addendum)
[facility] psychotherapy note  Name: Stephen Hartman Date: 11/19/2022 MRN: [id number] DOB: [birth year] PCP: Maurice Small, MD (Inactive)  Time spent:  50 minutes  Time in 11: 00 a.m. time out: 11: 5 0  a.m.   Treatment:  ind. therapy  Mental Status Exam:    Appearance:    Casual     Behavior:   Appropriate  Motor:   WNL  Speech/Language:    Clear and Coherent  Affect:   Full range   Mood:   euthymic  Thought process:   Logical, linear, goal directed  Thought content:     WNL  Sensory/Perceptual disturbances:     none  Orientation:   x4  Attention:   Good  Concentration:   Good  Memory:   Intact  Fund of knowledge:    Consistent with age and development  Insight:     Good  Judgment:    Good  Impulse Control:   Good     Reported Symptoms:  history of mood swings, impulsivity, distractible, problems with focus  Risk Assessment: Danger to Self:  No Self-injurious Behavior: No Danger to Others: No Duty to Warn:no Physical Aggression / Violence:No  Access to Firearms a concern: no Gang Involvement:No  Patient / guardian was educated about steps to take if suicide or homicide risk level increases between visits: yes While future psychiatric events cannot be accurately predicted, the patient does not currently require acute inpatient psychiatric care and does not currently meet [facility] involuntary commitment criteria.   Medications: Current Outpatient Medications  Medication Sig Dispense Refill   buPROPion (WELLBUTRIN XL) 150 MG 24 hr tablet Take 150 mg by mouth daily.     buPROPion (WELLBUTRIN XL) 150 MG 24 hr tablet TAKE 3 TABLETS BY MOUTH EVERY MORNING 270 tablet 1   buPROPion (WELLBUTRIN XL) 150 MG 24 hr tablet TAKE 3 TABLETS BY MOUTH EVERY MORNING 270 tablet 1   buPROPion (WELLBUTRIN XL) 150 MG 24 hr tablet Take 3 tablets by mouth every morning 270 tablet 1   buPROPion (WELLBUTRIN XL) 150 MG 24 hr tablet Take 3 tablets (450 mg total) by mouth in  the morning. 270 tablet 0   buPROPion (WELLBUTRIN XL) 150 MG 24 hr tablet Take 3 tablets (450 mg total) by mouth in the morning. 270 tablet 1   buPROPion (WELLBUTRIN XL) 150 MG 24 hr tablet Take 3 tablets by mouth every morning. 270 tablet 1   cyclobenzaprine (FLEXERIL) 10 MG tablet Take 1 tablet by mouth 2 times daily as needed for muscle spasms. 20 tablet 0   gabapentin (NEURONTIN) 300 MG capsule Take 1 capsule by mouth at bedtime. 15 capsule 0   HYDROcodone-acetaminophen (NORCO) 10-325 MG tablet Take 1 tablet by mouth every six to eight hours 15 tablet 0   Insulin Pen Needle (UNIFINE PENTIPS) 32G X 4 MM MISC Use as directed to inject with Saxenda 100 each 4   ketorolac (TORADOL) 10 MG tablet Take 1 tablet  by mouth every 6  hours as needed. 20 tablet 0   lamoTRIgine (LAMICTAL) 100 MG tablet Take 1 tablet (100 mg total) by mouth daily. (Patient taking differently: Take 75 mg by mouth daily.) 30 tablet 3   lamoTRIgine (LAMICTAL) 200 MG tablet TAKE 1 TABLET BY MOUTH AT BEDTIME (IF OUT OF MEDICATION MORE THAN 1 WEEK DO NOT RESTART, CALL OFFICE) (IF RASH DEVELOPS, STOP AND CALL OFFICE 90 tablet 1   lamoTRIgine (LAMICTAL) 200 MG tablet TAKE 1 TABLET BY MOUTH  EVERY NIGHT AT BEDTIME. IF OUT OF LAMICTAL MORE THAN 1 WEEK DO NOT RESTART, CALL OFFICE. IF RASH DEVELOPS, STOP MED AND CALL MD 90 tablet 1   lamoTRIgine (LAMICTAL) 200 MG tablet Take 1 tablet by mouth at bedtime (if out of lamictal more than 1 week do not restart, call office)(if rash develops, stop med and call office) 90 tablet 1   lamoTRIgine (LAMICTAL) 200 MG tablet Take 1 tablet by mouth at bedtime (if out of lamictal more than 1 wk do not restart, call office)(if rash develops, stop med and call office) 90 tablet 1   lamoTRIgine (LAMICTAL) 200 MG tablet Take 1 tablet (200 mg total) by mouth at bedtime.  (if out of lamictal more than 1 week do not restart, call office)(if rash develops, stop med and call office) 90 tablet 0   lamoTRIgine  (LAMICTAL) 200 MG tablet Take 1 tablet by mouth at bedtime (if out of lamictal more than 1 week do not restart, call office)(if rash develops, stop med and call office) 90 tablet 1   lansoprazole (PREVACID) 30 MG capsule      lansoprazole (PREVACID) 30 MG capsule Take 1 capsule (30 mg total) by mouth daily. 90 capsule 3   lansoprazole (PREVACID) 30 MG capsule Take 1 capsule (30 mg total) by mouth daily. 30 capsule 11   Liraglutide -Weight Management (SAXENDA) 18 MG/3ML SOPN Inject 0.6 mg once daily for a week, then increase to 1.2 mg week two, 1.8 mg week three, 2.4 mg week four, and finally 3 mg as tolerated. 15 mL 8   Liraglutide -Weight Management (SAXENDA) 18 MG/3ML SOPN Inject 3 mg into the skin once a day 15 mL 8   Liraglutide -Weight Management (SAXENDA) 18 MG/3ML SOPN Inject 3 mg into the skin daily. 15 mL 0   methylphenidate (METADATE CD) 20 MG CR capsule Take 1 capsule by mouth daily 30 capsule 0   methylphenidate (METADATE CD) 20 MG CR capsule Take 1 capsule (20 mg total) by mouth daily. 30 capsule 0   predniSONE (DELTASONE) 20 MG tablet Take 3 tablets every morning for 2 days, then 2 tablets every morning for 2 days, then 1 tablet every morning for 2 days. 12 tablet 0   Vilazodone HCl 20 MG TABS Take 1 tablet (20 mg total) by mouth daily after breakfast. 30 tablet 3   Vitamin D, Ergocalciferol, 50000 units CAPS Take 50,000 Units (1 capsule) by mouth once a week. 12 capsule 0   No current facility-administered medications for this visit.    Allergies  Allergen Reactions   Erythromycin Nausea And Vomiting   Subjective: Patient arrived on time for today's session.  Assessed progress where he shared that he is improving somewhat on trying to be more diligent in completing household tasks.  He stated that is an ongoing issue with feels he has been more effective with follow-through recently.  Reports feeling depressed but not severely as he has in the past, denies any SI.  Shared how he  continues to meet all responsibilities and objectives at work, has been working his job for the past 10 years, however, feels he has lost some motivation there.  Some ongoing challenges at home, trying to be more engaged with keeping up with household tasks, some strain in his marital relationship but reports they are in a better place than they were about a year ago as they had to make changes.  He stated that his wife took over the finances as this  was his responsibility however, he was not fully honest about how he would spend money which he understands would be upsetting. Reports having other supportive relationships, some friendships but reports that he does not identify and process deeper feelings with anyone.  He is open to using his time in therapy to improve his ability to communicate his feelings.  Interventions: Supportive therapy, motivational interviewing  Diagnoses:    ICD-10-CM   1. Bipolar 2 disorder (HCC)  F31.81         Plan: Patient is to use coping skills as identified in session to improve emotional regulation, impulses and communication in his marital relationship.  Patient to utilize his support system.  Long-term goal:   To improve his communication in his marital relationship, to allow himself to identify and process feelings. To improve consistency with completing tasks at home.  Short-term goal:  To improve communication in his marriage, specifically being honest and open about his feelings and avoid lying behaviors. To increase consistency with completing household tasks by creating and adhering to a daily schedule.   Waldron Session, [facility]

## 2022-12-08 ENCOUNTER — Other Ambulatory Visit (HOSPITAL_COMMUNITY): Payer: Self-pay

## 2022-12-08 ENCOUNTER — Other Ambulatory Visit: Payer: Self-pay | Admitting: Behavioral Health

## 2022-12-08 ENCOUNTER — Other Ambulatory Visit (HOSPITAL_BASED_OUTPATIENT_CLINIC_OR_DEPARTMENT_OTHER): Payer: Self-pay

## 2022-12-08 DIAGNOSIS — F902 Attention-deficit hyperactivity disorder, combined type: Secondary | ICD-10-CM

## 2022-12-09 ENCOUNTER — Other Ambulatory Visit (HOSPITAL_COMMUNITY): Payer: Self-pay

## 2022-12-09 MED ORDER — METHYLPHENIDATE HCL ER (CD) 20 MG PO CPCR
20.0000 mg | ORAL_CAPSULE | Freq: Every day | ORAL | 0 refills | Status: DC
Start: 2022-12-09 — End: 2023-01-09
  Filled 2022-12-09 – 2022-12-21 (×2): qty 30, 30d supply, fill #0

## 2022-12-16 ENCOUNTER — Other Ambulatory Visit (HOSPITAL_COMMUNITY): Payer: Self-pay

## 2022-12-18 ENCOUNTER — Other Ambulatory Visit (HOSPITAL_COMMUNITY): Payer: Self-pay

## 2022-12-18 ENCOUNTER — Ambulatory Visit: Payer: 59 | Admitting: Mental Health

## 2022-12-18 DIAGNOSIS — Z0389 Encounter for observation for other suspected diseases and conditions ruled out: Secondary | ICD-10-CM

## 2022-12-18 NOTE — Progress Notes (Deleted)
[facility] psychotherapy note  Name: Stephen Hartman Date: 12/18/2022 MRN: [id number] DOB: September 16, 1985 PCP: Maurice Small, MD (Inactive)  Time spent:  50 minutes  Time in 11: 00 a.m. time out: 11: 5 0  a.m.   Treatment:  ind. therapy  Mental Status Exam:    Appearance:    Casual     Behavior:   Appropriate  Motor:   WNL  Speech/Language:    Clear and Coherent  Affect:   Full range   Mood:   euthymic  Thought process:   Logical, linear, goal directed  Thought content:     WNL  Sensory/Perceptual disturbances:     none  Orientation:   x4  Attention:   Good  Concentration:   Good  Memory:   Intact  Fund of knowledge:    Consistent with age and development  Insight:     Good  Judgment:    Good  Impulse Control:   Good     Reported Symptoms:  history of mood swings, impulsivity, distractible, problems with focus  Risk Assessment: Danger to Self:  No Self-injurious Behavior: No Danger to Others: No Duty to Warn:no Physical Aggression / Violence:No  Access to Firearms a concern: no Gang Involvement:No  Patient / guardian was educated about steps to take if suicide or homicide risk level increases between visits: yes While future psychiatric events cannot be accurately predicted, the patient does not currently require acute inpatient psychiatric care and does not currently meet [facility] involuntary commitment criteria.   Medications: Current Outpatient Medications  Medication Sig Dispense Refill   buPROPion (WELLBUTRIN XL) 150 MG 24 hr tablet Take 150 mg by mouth daily.     buPROPion (WELLBUTRIN XL) 150 MG 24 hr tablet TAKE 3 TABLETS BY MOUTH EVERY MORNING 270 tablet 1   buPROPion (WELLBUTRIN XL) 150 MG 24 hr tablet TAKE 3 TABLETS BY MOUTH EVERY MORNING 270 tablet 1   buPROPion (WELLBUTRIN XL) 150 MG 24 hr tablet Take 3 tablets by mouth every morning 270 tablet 1   buPROPion (WELLBUTRIN XL) 150 MG 24 hr tablet Take 3 tablets (450 mg total) by mouth in  the morning. 270 tablet 0   buPROPion (WELLBUTRIN XL) 150 MG 24 hr tablet Take 3 tablets (450 mg total) by mouth in the morning. 270 tablet 1   buPROPion (WELLBUTRIN XL) 150 MG 24 hr tablet Take 3 tablets by mouth every morning. 270 tablet 1   cyclobenzaprine (FLEXERIL) 10 MG tablet Take 1 tablet by mouth 2 times daily as needed for muscle spasms. 20 tablet 0   gabapentin (NEURONTIN) 300 MG capsule Take 1 capsule by mouth at bedtime. 15 capsule 0   HYDROcodone-acetaminophen (NORCO) 10-325 MG tablet Take 1 tablet by mouth every six to eight hours 15 tablet 0   Insulin Pen Needle (UNIFINE PENTIPS) 32G X 4 MM MISC Use as directed to inject with Saxenda 100 each 4   ketorolac (TORADOL) 10 MG tablet Take 1 tablet  by mouth every 6  hours as needed. 20 tablet 0   lamoTRIgine (LAMICTAL) 100 MG tablet Take 1 tablet (100 mg total) by mouth daily. (Patient taking differently: Take 75 mg by mouth daily.) 30 tablet 3   lamoTRIgine (LAMICTAL) 200 MG tablet TAKE 1 TABLET BY MOUTH AT BEDTIME (IF OUT OF MEDICATION MORE THAN 1 WEEK DO NOT RESTART, CALL OFFICE) (IF RASH DEVELOPS, STOP AND CALL OFFICE 90 tablet 1   lamoTRIgine (LAMICTAL) 200 MG tablet TAKE 1 TABLET BY MOUTH  EVERY NIGHT AT BEDTIME. IF OUT OF LAMICTAL MORE THAN 1 WEEK DO NOT RESTART, CALL OFFICE. IF RASH DEVELOPS, STOP MED AND CALL MD 90 tablet 1   lamoTRIgine (LAMICTAL) 200 MG tablet Take 1 tablet by mouth at bedtime (if out of lamictal more than 1 week do not restart, call office)(if rash develops, stop med and call office) 90 tablet 1   lamoTRIgine (LAMICTAL) 200 MG tablet Take 1 tablet by mouth at bedtime (if out of lamictal more than 1 wk do not restart, call office)(if rash develops, stop med and call office) 90 tablet 1   lamoTRIgine (LAMICTAL) 200 MG tablet Take 1 tablet (200 mg total) by mouth at bedtime.  (if out of lamictal more than 1 week do not restart, call office)(if rash develops, stop med and call office) 90 tablet 0   lamoTRIgine  (LAMICTAL) 200 MG tablet Take 1 tablet by mouth at bedtime (if out of lamictal more than 1 week do not restart, call office)(if rash develops, stop med and call office) 90 tablet 1   lansoprazole (PREVACID) 30 MG capsule      lansoprazole (PREVACID) 30 MG capsule Take 1 capsule (30 mg total) by mouth daily. 90 capsule 3   lansoprazole (PREVACID) 30 MG capsule Take 1 capsule (30 mg total) by mouth daily. 30 capsule 11   Liraglutide -Weight Management (SAXENDA) 18 MG/3ML SOPN Inject 0.6 mg once daily for a week, then increase to 1.2 mg week two, 1.8 mg week three, 2.4 mg week four, and finally 3 mg as tolerated. 15 mL 8   Liraglutide -Weight Management (SAXENDA) 18 MG/3ML SOPN Inject 3 mg into the skin once a day 15 mL 8   Liraglutide -Weight Management (SAXENDA) 18 MG/3ML SOPN Inject 3 mg into the skin daily. 15 mL 0   methylphenidate (METADATE CD) 20 MG CR capsule Take 1 capsule by mouth daily 30 capsule 0   methylphenidate (METADATE CD) 20 MG CR capsule Take 1 capsule (20 mg total) by mouth daily. 30 capsule 0   predniSONE (DELTASONE) 20 MG tablet Take 3 tablets every morning for 2 days, then 2 tablets every morning for 2 days, then 1 tablet every morning for 2 days. 12 tablet 0   Vilazodone HCl 20 MG TABS Take 1 tablet (20 mg total) by mouth daily after breakfast. 30 tablet 3   Vitamin D, Ergocalciferol, 50000 units CAPS Take 50,000 Units (1 capsule) by mouth once a week. 12 capsule 0   No current facility-administered medications for this visit.    Allergies  Allergen Reactions   Erythromycin Nausea And Vomiting   Subjective: Patient arrived on time for today's session.     Assessed progress where he shared that he is improving somewhat on trying to be more diligent in completing household tasks.  He stated that is an ongoing issue with feels he has been more effective with follow-through recently.  Reports feeling depressed but not severely as he has in the past, denies any SI.  Shared how  he continues to meet all responsibilities and objectives at work, has been working his job for the past 10 years, however, feels he has lost some motivation there.  Some ongoing challenges at home, trying to be more engaged with keeping up with household tasks, some strain in his marital relationship but reports they are in a better place than they were about a year ago as they had to make changes.  He stated that his wife took over the  finances as this was his responsibility however, he was not fully honest about how he would spend money which he understands would be upsetting. Reports having other supportive relationships, some friendships but reports that he does not identify and process deeper feelings with anyone.  He is open to using his time in therapy to improve his ability to communicate his feelings.  Interventions: Supportive therapy, motivational interviewing  Diagnoses:  No diagnosis found.    Plan: Patient to utilize coping as identified in session.  Patient to utilize his support system.   Waldron Session, [facility]

## 2022-12-21 ENCOUNTER — Other Ambulatory Visit (HOSPITAL_COMMUNITY): Payer: Self-pay

## 2022-12-31 ENCOUNTER — Ambulatory Visit (INDEPENDENT_AMBULATORY_CARE_PROVIDER_SITE_OTHER): Payer: Self-pay | Admitting: Mental Health

## 2022-12-31 DIAGNOSIS — Z0389 Encounter for observation for other suspected diseases and conditions ruled out: Secondary | ICD-10-CM

## 2022-12-31 NOTE — Progress Notes (Signed)
Chg for no show 12/31/22

## 2022-12-31 NOTE — Progress Notes (Signed)
No chg fro 12/18/22 missed appt.

## 2023-01-09 ENCOUNTER — Other Ambulatory Visit: Payer: Self-pay | Admitting: Behavioral Health

## 2023-01-09 ENCOUNTER — Other Ambulatory Visit (HOSPITAL_COMMUNITY): Payer: Self-pay

## 2023-01-09 DIAGNOSIS — F902 Attention-deficit hyperactivity disorder, combined type: Secondary | ICD-10-CM

## 2023-01-10 NOTE — Telephone Encounter (Signed)
Due 10/14

## 2023-01-11 ENCOUNTER — Other Ambulatory Visit: Payer: Self-pay

## 2023-01-12 ENCOUNTER — Other Ambulatory Visit (HOSPITAL_COMMUNITY): Payer: Self-pay

## 2023-01-13 ENCOUNTER — Ambulatory Visit: Payer: 59 | Admitting: Mental Health

## 2023-01-13 DIAGNOSIS — F3181 Bipolar II disorder: Secondary | ICD-10-CM

## 2023-01-13 DIAGNOSIS — F902 Attention-deficit hyperactivity disorder, combined type: Secondary | ICD-10-CM

## 2023-01-13 NOTE — Progress Notes (Signed)
[facility] psychotherapy note  Name: Stephen Hartman Date: 01/13/2023 MRN: [id number] DOB: 1986-03-04 PCP: Maurice Small, MD (Inactive)  Time spent:  49 minutes  Time in: 10: 00 a.m. time out 10: 4 9 AM   Treatment:  ind. therapy  Mental Status Exam:    Appearance:    Casual     Behavior:   Appropriate  Motor:   WNL  Speech/Language:    Clear and Coherent  Affect:   Full range   Mood:   euthymic  Thought process:   Logical, linear, goal directed  Thought content:     WNL  Sensory/Perceptual disturbances:     none  Orientation:   x4  Attention:   Good  Concentration:   Good  Memory:   Intact  Fund of knowledge:    Consistent with age and development  Insight:     Good  Judgment:    Good  Impulse Control:   Good     Reported Symptoms:  history of mood swings, impulsivity, distractible, problems with focus  Risk Assessment: Danger to Self:  No Self-injurious Behavior: No Danger to Others: No Duty to Warn:no Physical Aggression / Violence:No  Access to Firearms a concern: no Gang Involvement:No  Patient / guardian was educated about steps to take if suicide or homicide risk level increases between visits: yes While future psychiatric events cannot be accurately predicted, the patient does not currently require acute inpatient psychiatric care and does not currently meet [facility] involuntary commitment criteria.   Medications: Current Outpatient Medications  Medication Sig Dispense Refill   buPROPion (WELLBUTRIN XL) 150 MG 24 hr tablet Take 150 mg by mouth daily.     buPROPion (WELLBUTRIN XL) 150 MG 24 hr tablet TAKE 3 TABLETS BY MOUTH EVERY MORNING 270 tablet 1   buPROPion (WELLBUTRIN XL) 150 MG 24 hr tablet TAKE 3 TABLETS BY MOUTH EVERY MORNING 270 tablet 1   buPROPion (WELLBUTRIN XL) 150 MG 24 hr tablet Take 3 tablets by mouth every morning 270 tablet 1   buPROPion (WELLBUTRIN XL) 150 MG 24 hr tablet Take 3 tablets (450 mg total) by mouth in the  morning. 270 tablet 0   buPROPion (WELLBUTRIN XL) 150 MG 24 hr tablet Take 3 tablets (450 mg total) by mouth in the morning. 270 tablet 1   buPROPion (WELLBUTRIN XL) 150 MG 24 hr tablet Take 3 tablets by mouth every morning. 270 tablet 1   cyclobenzaprine (FLEXERIL) 10 MG tablet Take 1 tablet by mouth 2 times daily as needed for muscle spasms. 20 tablet 0   gabapentin (NEURONTIN) 300 MG capsule Take 1 capsule by mouth at bedtime. 15 capsule 0   HYDROcodone-acetaminophen (NORCO) 10-325 MG tablet Take 1 tablet by mouth every six to eight hours 15 tablet 0   Insulin Pen Needle (UNIFINE PENTIPS) 32G X 4 MM MISC Use as directed to inject with Saxenda 100 each 4   ketorolac (TORADOL) 10 MG tablet Take 1 tablet  by mouth every 6  hours as needed. 20 tablet 0   lamoTRIgine (LAMICTAL) 100 MG tablet Take 1 tablet (100 mg total) by mouth daily. (Patient taking differently: Take 75 mg by mouth daily.) 30 tablet 3   lamoTRIgine (LAMICTAL) 200 MG tablet TAKE 1 TABLET BY MOUTH AT BEDTIME (IF OUT OF MEDICATION MORE THAN 1 WEEK DO NOT RESTART, CALL OFFICE) (IF RASH DEVELOPS, STOP AND CALL OFFICE 90 tablet 1   lamoTRIgine (LAMICTAL) 200 MG tablet TAKE 1 TABLET BY MOUTH EVERY  NIGHT AT BEDTIME. IF OUT OF LAMICTAL MORE THAN 1 WEEK DO NOT RESTART, CALL OFFICE. IF RASH DEVELOPS, STOP MED AND CALL MD 90 tablet 1   lamoTRIgine (LAMICTAL) 200 MG tablet Take 1 tablet by mouth at bedtime (if out of lamictal more than 1 week do not restart, call office)(if rash develops, stop med and call office) 90 tablet 1   lamoTRIgine (LAMICTAL) 200 MG tablet Take 1 tablet by mouth at bedtime (if out of lamictal more than 1 wk do not restart, call office)(if rash develops, stop med and call office) 90 tablet 1   lamoTRIgine (LAMICTAL) 200 MG tablet Take 1 tablet (200 mg total) by mouth at bedtime.  (if out of lamictal more than 1 week do not restart, call office)(if rash develops, stop med and call office) 90 tablet 0   lamoTRIgine (LAMICTAL)  200 MG tablet Take 1 tablet by mouth at bedtime (if out of lamictal more than 1 week do not restart, call office)(if rash develops, stop med and call office) 90 tablet 1   lansoprazole (PREVACID) 30 MG capsule      lansoprazole (PREVACID) 30 MG capsule Take 1 capsule (30 mg total) by mouth daily. 90 capsule 3   lansoprazole (PREVACID) 30 MG capsule Take 1 capsule (30 mg total) by mouth daily. 30 capsule 11   Liraglutide -Weight Management (SAXENDA) 18 MG/3ML SOPN Inject 0.6 mg once daily for a week, then increase to 1.2 mg week two, 1.8 mg week three, 2.4 mg week four, and finally 3 mg as tolerated. 15 mL 8   Liraglutide -Weight Management (SAXENDA) 18 MG/3ML SOPN Inject 3 mg into the skin once a day 15 mL 8   Liraglutide -Weight Management (SAXENDA) 18 MG/3ML SOPN Inject 3 mg into the skin daily. 15 mL 0   methylphenidate (METADATE CD) 20 MG CR capsule Take 1 capsule by mouth daily 30 capsule 0   methylphenidate (METADATE CD) 20 MG CR capsule Take 1 capsule (20 mg total) by mouth daily. 30 capsule 0   predniSONE (DELTASONE) 20 MG tablet Take 3 tablets every morning for 2 days, then 2 tablets every morning for 2 days, then 1 tablet every morning for 2 days. 12 tablet 0   Vilazodone HCl 20 MG TABS Take 1 tablet (20 mg total) by mouth daily after breakfast. 30 tablet 3   Vitamin D, Ergocalciferol, 50000 units CAPS Take 50,000 Units (1 capsule) by mouth once a week. 12 capsule 0   No current facility-administered medications for this visit.    Allergies  Allergen Reactions   Erythromycin Nausea And Vomiting   Subjective: Patient arrived on time for today's session.  Patient shared progress, how he continues to struggle with completing tasks at home.  He stated that he tries to implement structure but struggles, is more successful with keeping up with other aspects of the household such as getting his daughters to appointments, school.  He plans to try to continue to work on staying organized and  schedule his day to be more effective.  He went on to share more details regarding his marital relationship, challenges they have had over the years that relate to intimacy.  He also shared his feelings regarding his sexual orientation which he has not shared with anyone, including his wife.  Provide space for patient to detail and process feelings related.  Facilitated his further sharing challenges in his marital relationship as he identified being focused on his family.      Interventions:  Supportive therapy, motivational interviewing  Diagnoses:    ICD-10-CM   1. Attention deficit hyperactivity disorder (ADHD), combined type  F90.2     2. Bipolar 2 disorder (HCC)  F31.81          Plan: Patient is to use coping skills as identified in session to improve emotional regulation, impulses and communication in his marital relationship.  Patient to utilize his support system.  Long-term goal:   To improve his communication in his marital relationship, to allow himself to identify and process feelings. To improve consistency with completing tasks at home.  Short-term goal:  To improve communication in his marriage, specifically being honest and open about his feelings and avoid lying behaviors. To increase consistency with completing household tasks by creating and adhering to a daily schedule.   Waldron Session, [facility]

## 2023-01-18 ENCOUNTER — Other Ambulatory Visit (HOSPITAL_COMMUNITY): Payer: Self-pay

## 2023-01-18 MED ORDER — METHYLPHENIDATE HCL ER (CD) 20 MG PO CPCR
20.0000 mg | ORAL_CAPSULE | Freq: Every day | ORAL | 0 refills | Status: DC
Start: 2023-01-18 — End: 2023-02-11
  Filled 2023-01-18: qty 30, 30d supply, fill #0

## 2023-01-27 ENCOUNTER — Other Ambulatory Visit (HOSPITAL_COMMUNITY): Payer: Self-pay

## 2023-01-27 DIAGNOSIS — M545 Low back pain, unspecified: Secondary | ICD-10-CM | POA: Diagnosis not present

## 2023-01-27 MED ORDER — METHOCARBAMOL 500 MG PO TABS
ORAL_TABLET | ORAL | 0 refills | Status: DC
  Filled 2023-01-27 (×2): qty 40, 10d supply, fill #0

## 2023-01-28 ENCOUNTER — Other Ambulatory Visit (HOSPITAL_COMMUNITY): Payer: Self-pay

## 2023-01-29 ENCOUNTER — Other Ambulatory Visit: Payer: Self-pay | Admitting: Physician Assistant

## 2023-01-29 DIAGNOSIS — M48061 Spinal stenosis, lumbar region without neurogenic claudication: Secondary | ICD-10-CM

## 2023-02-09 ENCOUNTER — Ambulatory Visit (INDEPENDENT_AMBULATORY_CARE_PROVIDER_SITE_OTHER): Payer: Self-pay | Admitting: Behavioral Health

## 2023-02-09 DIAGNOSIS — Z0389 Encounter for observation for other suspected diseases and conditions ruled out: Secondary | ICD-10-CM

## 2023-02-09 NOTE — Progress Notes (Signed)
Pt did not show for scheduled appt and did not provide 24 notice as required by policy. Additional fees to be assessed.

## 2023-02-09 NOTE — Discharge Instructions (Signed)

## 2023-02-10 ENCOUNTER — Inpatient Hospital Stay
Admission: RE | Admit: 2023-02-10 | Discharge: 2023-02-10 | Disposition: A | Discharge status: Home / Self Care | Payer: 59 | Source: Ambulatory Visit | Attending: Physician Assistant | Admitting: Physician Assistant

## 2023-02-10 DIAGNOSIS — M48061 Spinal stenosis, lumbar region without neurogenic claudication: Secondary | ICD-10-CM

## 2023-02-10 DIAGNOSIS — M4807 Spinal stenosis, lumbosacral region: Secondary | ICD-10-CM | POA: Diagnosis not present

## 2023-02-10 DIAGNOSIS — M5117 Intervertebral disc disorders with radiculopathy, lumbosacral region: Secondary | ICD-10-CM | POA: Diagnosis not present

## 2023-02-10 MED ORDER — METHYLPREDNISOLONE ACETATE 40 MG/ML INJ SUSP (RADIOLOG
80.0000 mg | Freq: Once | INTRAMUSCULAR | Status: AC
  Administered 2023-02-10: 80 mg via EPIDURAL

## 2023-02-10 MED ORDER — IOPAMIDOL (ISOVUE-M 200) INJECTION 41%
1.0000 mL | Freq: Once | INTRAMUSCULAR | Status: AC
  Administered 2023-02-10: 1 mL via EPIDURAL

## 2023-02-11 ENCOUNTER — Other Ambulatory Visit (HOSPITAL_COMMUNITY): Payer: Self-pay

## 2023-02-11 ENCOUNTER — Other Ambulatory Visit: Payer: Self-pay | Admitting: Behavioral Health

## 2023-02-11 DIAGNOSIS — F902 Attention-deficit hyperactivity disorder, combined type: Secondary | ICD-10-CM

## 2023-02-11 MED ORDER — METHYLPHENIDATE HCL ER (CD) 20 MG PO CPCR
20.0000 mg | ORAL_CAPSULE | Freq: Every day | ORAL | 0 refills | Status: DC
  Filled 2023-02-15: qty 30, 30d supply, fill #0

## 2023-02-11 NOTE — Telephone Encounter (Signed)
Changed fill date to 11/11

## 2023-02-11 NOTE — Telephone Encounter (Signed)
LF 10/14; LV 08/5 NV needs scheduled.

## 2023-02-11 NOTE — Telephone Encounter (Signed)
Please schedule pt appt.  

## 2023-02-12 ENCOUNTER — Other Ambulatory Visit (HOSPITAL_COMMUNITY): Payer: Self-pay

## 2023-02-12 ENCOUNTER — Ambulatory Visit (INDEPENDENT_AMBULATORY_CARE_PROVIDER_SITE_OTHER): Payer: Self-pay | Admitting: Mental Health

## 2023-02-12 DIAGNOSIS — Z0389 Encounter for observation for other suspected diseases and conditions ruled out: Secondary | ICD-10-CM

## 2023-02-15 ENCOUNTER — Other Ambulatory Visit: Payer: Self-pay

## 2023-02-15 ENCOUNTER — Other Ambulatory Visit (HOSPITAL_COMMUNITY): Payer: Self-pay

## 2023-02-15 NOTE — Progress Notes (Signed)
No show fee for missed session 02/12/23.

## 2023-02-16 ENCOUNTER — Other Ambulatory Visit (HOSPITAL_COMMUNITY): Payer: Self-pay

## 2023-02-19 ENCOUNTER — Other Ambulatory Visit (HOSPITAL_COMMUNITY): Payer: Self-pay

## 2023-02-25 ENCOUNTER — Ambulatory Visit: Payer: 59 | Admitting: Mental Health

## 2023-02-25 NOTE — Progress Notes (Signed)
[facility] psychotherapy note  Name: Stephen Hartman Date: 02/25/2023 MRN: [id number] DOB: 21-Dec-1985 PCP: Maurice Small, MD (Inactive)  Time spent:  50 minutes  Time in: 10: 00 a.m. time out 10: 50 AM   Treatment:  ind. therapy  Mental Status Exam:    Appearance:    Casual     Behavior:   Appropriate  Motor:   WNL  Speech/Language:    Clear and Coherent  Affect:   Full range   Mood:   euthymic  Thought process:   Logical, linear, goal directed  Thought content:     WNL  Sensory/Perceptual disturbances:     none  Orientation:   x4  Attention:   Good  Concentration:   Good  Memory:   Intact  Fund of knowledge:    Consistent with age and development  Insight:     Good  Judgment:    Good  Impulse Control:   Good     Reported Symptoms:  history of mood swings, impulsivity, distractible, problems with focus  Risk Assessment: Danger to Self:  No Self-injurious Behavior: No Danger to Others: No Duty to Warn:no Physical Aggression / Violence:No  Access to Firearms a concern: no Gang Involvement:No  Patient / guardian was educated about steps to take if suicide or homicide risk level increases between visits: yes While future psychiatric events cannot be accurately predicted, the patient does not currently require acute inpatient psychiatric care and does not currently meet [facility] involuntary commitment criteria.   Medications: Current Outpatient Medications  Medication Sig Dispense Refill   buPROPion (WELLBUTRIN XL) 150 MG 24 hr tablet Take 150 mg by mouth daily.     buPROPion (WELLBUTRIN XL) 150 MG 24 hr tablet TAKE 3 TABLETS BY MOUTH EVERY MORNING 270 tablet 1   buPROPion (WELLBUTRIN XL) 150 MG 24 hr tablet TAKE 3 TABLETS BY MOUTH EVERY MORNING 270 tablet 1   buPROPion (WELLBUTRIN XL) 150 MG 24 hr tablet Take 3 tablets by mouth every morning 270 tablet 1   buPROPion (WELLBUTRIN XL) 150 MG 24 hr tablet Take 3 tablets (450 mg total) by mouth in the  morning. 270 tablet 0   buPROPion (WELLBUTRIN XL) 150 MG 24 hr tablet Take 3 tablets (450 mg total) by mouth in the morning. 270 tablet 1   buPROPion (WELLBUTRIN XL) 150 MG 24 hr tablet Take 3 tablets by mouth every morning. 270 tablet 1   cyclobenzaprine (FLEXERIL) 10 MG tablet Take 1 tablet by mouth 2 times daily as needed for muscle spasms. 20 tablet 0   gabapentin (NEURONTIN) 300 MG capsule Take 1 capsule by mouth at bedtime. 15 capsule 0   HYDROcodone-acetaminophen (NORCO) 10-325 MG tablet Take 1 tablet by mouth every six to eight hours 15 tablet 0   Insulin Pen Needle (UNIFINE PENTIPS) 32G X 4 MM MISC Use as directed to inject with Saxenda 100 each 4   ketorolac (TORADOL) 10 MG tablet Take 1 tablet  by mouth every 6  hours as needed. 20 tablet 0   lamoTRIgine (LAMICTAL) 100 MG tablet Take 1 tablet (100 mg total) by mouth daily. (Patient taking differently: Take 75 mg by mouth daily.) 30 tablet 3   lamoTRIgine (LAMICTAL) 200 MG tablet TAKE 1 TABLET BY MOUTH AT BEDTIME (IF OUT OF MEDICATION MORE THAN 1 WEEK DO NOT RESTART, CALL OFFICE) (IF RASH DEVELOPS, STOP AND CALL OFFICE 90 tablet 1   lamoTRIgine (LAMICTAL) 200 MG tablet TAKE 1 TABLET BY MOUTH EVERY NIGHT  AT BEDTIME. IF OUT OF LAMICTAL MORE THAN 1 WEEK DO NOT RESTART, CALL OFFICE. IF RASH DEVELOPS, STOP MED AND CALL MD 90 tablet 1   lamoTRIgine (LAMICTAL) 200 MG tablet Take 1 tablet by mouth at bedtime (if out of lamictal more than 1 week do not restart, call office)(if rash develops, stop med and call office) 90 tablet 1   lamoTRIgine (LAMICTAL) 200 MG tablet Take 1 tablet by mouth at bedtime (if out of lamictal more than 1 wk do not restart, call office)(if rash develops, stop med and call office) 90 tablet 1   lamoTRIgine (LAMICTAL) 200 MG tablet Take 1 tablet (200 mg total) by mouth at bedtime.  (if out of lamictal more than 1 week do not restart, call office)(if rash develops, stop med and call office) 90 tablet 0   lamoTRIgine (LAMICTAL)  200 MG tablet Take 1 tablet by mouth at bedtime (if out of lamictal more than 1 week do not restart, call office)(if rash develops, stop med and call office) 90 tablet 1   lansoprazole (PREVACID) 30 MG capsule      lansoprazole (PREVACID) 30 MG capsule Take 1 capsule (30 mg total) by mouth daily. 90 capsule 3   lansoprazole (PREVACID) 30 MG capsule Take 1 capsule (30 mg total) by mouth daily. 30 capsule 11   Liraglutide -Weight Management (SAXENDA) 18 MG/3ML SOPN Inject 0.6 mg once daily for a week, then increase to 1.2 mg week two, 1.8 mg week three, 2.4 mg week four, and finally 3 mg as tolerated. 15 mL 8   Liraglutide -Weight Management (SAXENDA) 18 MG/3ML SOPN Inject 3 mg into the skin once a day 15 mL 8   Liraglutide -Weight Management (SAXENDA) 18 MG/3ML SOPN Inject 3 mg into the skin daily. 15 mL 0   methocarbamol (ROBAXIN) 500 MG tablet Take 1 tablet by mouth every 6 to 8 hours as needed for spasm. 40 tablet 0   methylphenidate (METADATE CD) 20 MG CR capsule Take 1 capsule by mouth daily 30 capsule 0   methylphenidate (METADATE CD) 20 MG CR capsule Take 1 capsule (20 mg total) by mouth daily. 30 capsule 0   predniSONE (DELTASONE) 20 MG tablet Take 3 tablets every morning for 2 days, then 2 tablets every morning for 2 days, then 1 tablet every morning for 2 days. 12 tablet 0   Vilazodone HCl 20 MG TABS Take 1 tablet (20 mg total) by mouth daily after breakfast. 30 tablet 3   Vitamin D, Ergocalciferol, 50000 units CAPS Take 50,000 Units (1 capsule) by mouth once a week. 12 capsule 0   No current facility-administered medications for this visit.    Allergies  Allergen Reactions   Erythromycin Nausea And Vomiting   Subjective: Patient arrived on time for today's session.  Patient shared progress, how he continues to struggle with completing tasks at home.  He stated that he tries to implement structure but struggles, is more successful with keeping up with other aspects of the household such  as getting his daughters to appointments, school.  He plans to try to continue to work on staying organized and schedule his day to be more effective.  He went on to share more details regarding his marital relationship, challenges they have had over the years that relate to intimacy.  He also shared his feelings regarding his sexual orientation which he has not shared with anyone, including his wife.  Provide space for patient to detail and process feelings related.  Facilitated his further sharing challenges in his marital relationship as he identified being focused on his family.      Interventions: Supportive therapy, motivational interviewing  Diagnoses:  No diagnosis found.      Plan: Patient is to use coping skills as identified in session to improve emotional regulation, impulses and communication in his marital relationship.  Patient to utilize his support system.  Long-term goal:   To improve his communication in his marital relationship, to allow himself to identify and process feelings. To improve consistency with completing tasks at home.  Short-term goal:  To improve communication in his marriage, specifically being honest and open about his feelings and avoid lying behaviors. To increase consistency with completing household tasks by creating and adhering to a daily schedule.   Waldron Session, [facility]

## 2023-03-11 ENCOUNTER — Other Ambulatory Visit: Payer: Self-pay | Admitting: Behavioral Health

## 2023-03-11 ENCOUNTER — Other Ambulatory Visit (HOSPITAL_COMMUNITY): Payer: Self-pay

## 2023-03-11 DIAGNOSIS — F902 Attention-deficit hyperactivity disorder, combined type: Secondary | ICD-10-CM

## 2023-03-11 NOTE — Telephone Encounter (Signed)
Was a no show last appt. Sent MyChart message to schedule.

## 2023-03-16 NOTE — Telephone Encounter (Signed)
Please call to schedule FU, did not respond to MyChart message.

## 2023-03-18 NOTE — Telephone Encounter (Signed)
MyChart message sent 12/5 to schedule FU.

## 2023-03-26 NOTE — Telephone Encounter (Signed)
Called and left vm for pt to call and schedule appt

## 2023-04-21 ENCOUNTER — Other Ambulatory Visit: Payer: Self-pay | Admitting: Behavioral Health

## 2023-04-21 ENCOUNTER — Other Ambulatory Visit (HOSPITAL_COMMUNITY): Payer: Self-pay

## 2023-04-21 DIAGNOSIS — F3181 Bipolar II disorder: Secondary | ICD-10-CM

## 2023-04-21 DIAGNOSIS — F411 Generalized anxiety disorder: Secondary | ICD-10-CM

## 2023-04-21 DIAGNOSIS — F902 Attention-deficit hyperactivity disorder, combined type: Secondary | ICD-10-CM

## 2023-04-22 ENCOUNTER — Other Ambulatory Visit: Payer: Self-pay

## 2023-04-22 ENCOUNTER — Other Ambulatory Visit (HOSPITAL_COMMUNITY): Payer: Self-pay

## 2023-04-22 MED ORDER — VILAZODONE HCL 20 MG PO TABS
20.0000 mg | ORAL_TABLET | Freq: Every day | ORAL | 0 refills | Status: DC
  Filled 2023-04-22: qty 30, 30d supply, fill #0

## 2023-04-22 MED ORDER — METHYLPHENIDATE HCL ER (CD) 20 MG PO CPCR
20.0000 mg | ORAL_CAPSULE | Freq: Every day | ORAL | 0 refills | Status: DC
  Filled 2023-04-22: qty 20, 20d supply, fill #0
  Filled 2023-04-23: qty 10, 10d supply, fill #0

## 2023-04-23 ENCOUNTER — Other Ambulatory Visit (HOSPITAL_COMMUNITY): Payer: Self-pay

## 2023-04-23 ENCOUNTER — Other Ambulatory Visit: Payer: Self-pay

## 2023-04-26 ENCOUNTER — Ambulatory Visit: Payer: Commercial Managed Care - PPO | Admitting: Behavioral Health

## 2023-04-26 ENCOUNTER — Other Ambulatory Visit (HOSPITAL_COMMUNITY): Payer: Self-pay

## 2023-04-26 ENCOUNTER — Other Ambulatory Visit: Payer: Self-pay

## 2023-05-07 ENCOUNTER — Other Ambulatory Visit (HOSPITAL_COMMUNITY): Payer: Self-pay

## 2023-05-10 ENCOUNTER — Other Ambulatory Visit (HOSPITAL_COMMUNITY): Payer: Self-pay

## 2023-05-20 ENCOUNTER — Encounter: Payer: Self-pay | Admitting: Behavioral Health

## 2023-05-20 ENCOUNTER — Other Ambulatory Visit (HOSPITAL_COMMUNITY): Payer: Self-pay

## 2023-05-20 ENCOUNTER — Ambulatory Visit: Payer: Commercial Managed Care - PPO | Admitting: Behavioral Health

## 2023-05-20 DIAGNOSIS — F411 Generalized anxiety disorder: Secondary | ICD-10-CM

## 2023-05-20 DIAGNOSIS — F3181 Bipolar II disorder: Secondary | ICD-10-CM

## 2023-05-20 DIAGNOSIS — F902 Attention-deficit hyperactivity disorder, combined type: Secondary | ICD-10-CM

## 2023-05-20 MED ORDER — BUPROPION HCL ER (XL) 150 MG PO TB24
450.0000 mg | ORAL_TABLET | Freq: Every morning | ORAL | 0 refills | Status: DC
  Filled 2023-05-20 – 2023-06-15 (×3): qty 270, 90d supply, fill #0

## 2023-05-20 MED ORDER — LAMOTRIGINE 200 MG PO TABS
200.0000 mg | ORAL_TABLET | Freq: Every evening | ORAL | 1 refills | Status: DC
  Filled 2023-05-20 – 2023-06-15 (×3): qty 90, 90d supply, fill #0
  Filled 2023-09-24: qty 90, 90d supply, fill #1

## 2023-05-20 MED ORDER — VILAZODONE HCL 20 MG PO TABS
20.0000 mg | ORAL_TABLET | Freq: Every day | ORAL | 1 refills | Status: DC
  Filled 2023-05-20: qty 74, 74d supply, fill #0
  Filled 2023-05-20: qty 16, 16d supply, fill #0

## 2023-05-20 MED ORDER — METHYLPHENIDATE HCL ER (CD) 20 MG PO CPCR
20.0000 mg | ORAL_CAPSULE | Freq: Every day | ORAL | 0 refills | Status: DC
  Filled 2023-05-20 – 2023-05-26 (×2): qty 30, 30d supply, fill #0

## 2023-05-20 NOTE — Progress Notes (Signed)
[facility]  Patient ID: SAGE KOPERA,  MRN: [id number]  PCP: Maurice Small, MD (Inactive)  Date of Evaluation: 05/20/2023 Time spent:20 minutes  Chief Complaint:  Chief Complaint   Anxiety; Depression; Follow-up; Medication Refill; Patient Education     HISTORY/CURRENT STATUS: HPI "[name]", 38 year old male presents to this office for follow up and medication management.  No psychosocial changes this visit. Says he is feeling much better since starting the Viibryd. He has had no recent SI. He does not want to adjust medications this visit.   He is currently rating his depression today at 3/10, and anxiety at 3/10.  He is sleeping 7 to 8 hours per night.    States that he has strong family support and that he feels safe. No mania, no psychosis, no auditory or visual hallucinations. No SI or HI    Past psychiatric medication trials: Lamictal Wellbutrin Methylphenidate Adderall Strattera Citalopram       Individual Medical History/ Review of Systems: Changes? :No   Allergies: Erythromycin  Current Medications:  Current Outpatient Medications:    buPROPion (WELLBUTRIN XL) 150 MG 24 hr tablet, Take 150 mg by mouth daily., Disp: , Rfl:    buPROPion (WELLBUTRIN XL) 150 MG 24 hr tablet, TAKE 3 TABLETS BY MOUTH EVERY MORNING, Disp: 270 tablet, Rfl: 1   buPROPion (WELLBUTRIN XL) 150 MG 24 hr tablet, TAKE 3 TABLETS BY MOUTH EVERY MORNING, Disp: 270 tablet, Rfl: 1   buPROPion (WELLBUTRIN XL) 150 MG 24 hr tablet, Take 3 tablets by mouth every morning, Disp: 270 tablet, Rfl: 1   buPROPion (WELLBUTRIN XL) 150 MG 24 hr tablet, Take 3 tablets (450 mg total) by mouth in the morning., Disp: 270 tablet, Rfl: 1   buPROPion (WELLBUTRIN XL) 150 MG 24 hr tablet, Take 3 tablets by mouth every morning., Disp: 270 tablet, Rfl: 1   buPROPion (WELLBUTRIN XL) 150 MG 24 hr tablet, Take 3 tablets (450 mg total) by mouth in the morning., Disp: 270 tablet, Rfl: 0   cyclobenzaprine  (FLEXERIL) 10 MG tablet, Take 1 tablet by mouth 2 times daily as needed for muscle spasms., Disp: 20 tablet, Rfl: 0   gabapentin (NEURONTIN) 300 MG capsule, Take 1 capsule by mouth at bedtime., Disp: 15 capsule, Rfl: 0   HYDROcodone-acetaminophen (NORCO) 10-325 MG tablet, Take 1 tablet by mouth every six to eight hours, Disp: 15 tablet, Rfl: 0   Insulin Pen Needle (UNIFINE PENTIPS) 32G X 4 MM MISC, Use as directed to inject with Saxenda, Disp: 100 each, Rfl: 4   ketorolac (TORADOL) 10 MG tablet, Take 1 tablet  by mouth every 6  hours as needed., Disp: 20 tablet, Rfl: 0   lamoTRIgine (LAMICTAL) 100 MG tablet, Take 1 tablet (100 mg total) by mouth daily. (Patient taking differently: Take 75 mg by mouth daily.), Disp: 30 tablet, Rfl: 3   lamoTRIgine (LAMICTAL) 200 MG tablet, TAKE 1 TABLET BY MOUTH AT BEDTIME (IF OUT OF MEDICATION MORE THAN 1 WEEK DO NOT RESTART, CALL OFFICE) (IF RASH DEVELOPS, STOP AND CALL OFFICE, Disp: 90 tablet, Rfl: 1   lamoTRIgine (LAMICTAL) 200 MG tablet, TAKE 1 TABLET BY MOUTH EVERY NIGHT AT BEDTIME. IF OUT OF LAMICTAL MORE THAN 1 WEEK DO NOT RESTART, CALL OFFICE. IF RASH DEVELOPS, STOP MED AND CALL MD, Disp: 90 tablet, Rfl: 1   lamoTRIgine (LAMICTAL) 200 MG tablet, Take 1 tablet by mouth at bedtime (if out of lamictal more than 1 week do not restart, call office)(if rash develops, stop  med and call office), Disp: 90 tablet, Rfl: 1   lamoTRIgine (LAMICTAL) 200 MG tablet, Take 1 tablet by mouth at bedtime (if out of lamictal more than 1 wk do not restart, call office)(if rash develops, stop med and call office), Disp: 90 tablet, Rfl: 1   lamoTRIgine (LAMICTAL) 200 MG tablet, Take 1 tablet (200 mg total) by mouth at bedtime.  (if out of lamictal more than 1 week do not restart, call office)(if rash develops, stop med and call office), Disp: 90 tablet, Rfl: 0   lamoTRIgine (LAMICTAL) 200 MG tablet, Take 1 tablet by mouth at bedtime (if out of lamictal more than 1 week do not restart,  call office)(if rash develops, stop med and call office), Disp: 90 tablet, Rfl: 1   lansoprazole (PREVACID) 30 MG capsule, , Disp: , Rfl:    lansoprazole (PREVACID) 30 MG capsule, Take 1 capsule (30 mg total) by mouth daily., Disp: 90 capsule, Rfl: 3   lansoprazole (PREVACID) 30 MG capsule, Take 1 capsule (30 mg total) by mouth daily., Disp: 30 capsule, Rfl: 11   Liraglutide -Weight Management (SAXENDA) 18 MG/3ML SOPN, Inject 0.6 mg once daily for a week, then increase to 1.2 mg week two, 1.8 mg week three, 2.4 mg week four, and finally 3 mg as tolerated., Disp: 15 mL, Rfl: 8   Liraglutide -Weight Management (SAXENDA) 18 MG/3ML SOPN, Inject 3 mg into the skin once a day, Disp: 15 mL, Rfl: 8   Liraglutide -Weight Management (SAXENDA) 18 MG/3ML SOPN, Inject 3 mg into the skin daily., Disp: 15 mL, Rfl: 0   methocarbamol (ROBAXIN) 500 MG tablet, Take 1 tablet by mouth every 6 to 8 hours as needed for spasm., Disp: 40 tablet, Rfl: 0   methylphenidate (METADATE CD) 20 MG CR capsule, Take 1 capsule by mouth daily, Disp: 30 capsule, Rfl: 0   methylphenidate (METADATE CD) 20 MG CR capsule, Take 1 capsule (20 mg total) by mouth daily., Disp: 30 capsule, Rfl: 0   predniSONE (DELTASONE) 20 MG tablet, Take 3 tablets every morning for 2 days, then 2 tablets every morning for 2 days, then 1 tablet every morning for 2 days., Disp: 12 tablet, Rfl: 0   Vilazodone HCl 20 MG TABS, Take 1 tablet (20 mg total) by mouth daily after breakfast., Disp: 90 tablet, Rfl: 1   Vitamin D, Ergocalciferol, 50000 units CAPS, Take 50,000 Units (1 capsule) by mouth once a week., Disp: 12 capsule, Rfl: 0 Medication Side Effects: none  Family Medical/ Social History: Changes? No  MENTAL HEALTH EXAM:  There were no vitals taken for this visit.There is no height or weight on file to calculate BMI.  General Appearance: Casual, Neat, and Well Groomed  Eye Contact:  Good  Speech:  Clear and Coherent  Volume:  Normal  Mood:  NA   Affect:  Appropriate  Thought Process:  Coherent  Orientation:  Full (Time, Place, and Person)  Thought Content: Logical   Suicidal Thoughts:  No  Homicidal Thoughts:  No  Memory:  WNL  Judgement:  Good  Insight:  Good  Psychomotor Activity:  Normal  Concentration:  Concentration: Good  Recall:  Good  Fund of Knowledge: Good  Language: Good  Assets:  Desire for Improvement  ADL's:  Intact  Cognition: WNL  Prognosis:  Good    DIAGNOSES:    ICD-10-CM   1. Bipolar 2 disorder (HCC)  F31.81 lamoTRIgine (LAMICTAL) 200 MG tablet    buPROPion (WELLBUTRIN XL) 150 MG 24 hr  tablet    Vilazodone HCl 20 MG TABS    2. Generalized anxiety disorder  F41.1 buPROPion (WELLBUTRIN XL) 150 MG 24 hr tablet    Vilazodone HCl 20 MG TABS    3. Attention deficit hyperactivity disorder (ADHD), combined type  F90.2 methylphenidate (METADATE CD) 20 MG CR capsule      Receiving Psychotherapy: No    RECOMMENDATIONS:   Greater than 50% of f20 min face to face time with patient was spent on counseling and coordination of care. We discussed his report of significant improvement. He likes his medication and is requesting no changes today.  We agreed today to: To continue Wellbutrin 450 mg XL daily. Pt taking three 150 mg tablets per day at same time.  To continue Lamictal 200 mg To continue Viibryd 20 mg daily.  Will report side effects of worsening symptoms promptly To follow-up in 12 weeks to reassess Provided emergency contact information Reviewed PDMP    Joan Flores, NP

## 2023-05-26 ENCOUNTER — Other Ambulatory Visit (HOSPITAL_COMMUNITY): Payer: Self-pay

## 2023-05-27 ENCOUNTER — Other Ambulatory Visit: Payer: Self-pay

## 2023-06-01 ENCOUNTER — Other Ambulatory Visit: Payer: Self-pay

## 2023-06-01 ENCOUNTER — Other Ambulatory Visit (HOSPITAL_COMMUNITY): Payer: Self-pay

## 2023-06-03 ENCOUNTER — Other Ambulatory Visit (HOSPITAL_COMMUNITY): Payer: Self-pay

## 2023-06-15 ENCOUNTER — Other Ambulatory Visit: Payer: Self-pay

## 2023-06-15 ENCOUNTER — Other Ambulatory Visit (HOSPITAL_COMMUNITY): Payer: Self-pay

## 2023-06-16 ENCOUNTER — Other Ambulatory Visit: Payer: Self-pay

## 2023-06-18 DIAGNOSIS — R0683 Snoring: Secondary | ICD-10-CM | POA: Diagnosis not present

## 2023-06-28 ENCOUNTER — Other Ambulatory Visit (HOSPITAL_COMMUNITY): Payer: Self-pay

## 2023-06-28 ENCOUNTER — Other Ambulatory Visit: Payer: Self-pay | Admitting: Behavioral Health

## 2023-06-28 DIAGNOSIS — F902 Attention-deficit hyperactivity disorder, combined type: Secondary | ICD-10-CM

## 2023-06-29 DIAGNOSIS — Z6841 Body Mass Index (BMI) 40.0 and over, adult: Secondary | ICD-10-CM | POA: Diagnosis not present

## 2023-06-29 DIAGNOSIS — G4719 Other hypersomnia: Secondary | ICD-10-CM | POA: Diagnosis not present

## 2023-06-29 DIAGNOSIS — R030 Elevated blood-pressure reading, without diagnosis of hypertension: Secondary | ICD-10-CM | POA: Diagnosis not present

## 2023-07-02 ENCOUNTER — Ambulatory Visit: Admitting: Mental Health

## 2023-07-02 ENCOUNTER — Other Ambulatory Visit (HOSPITAL_COMMUNITY): Payer: Self-pay

## 2023-07-02 MED ORDER — METHYLPHENIDATE HCL ER (CD) 20 MG PO CPCR
20.0000 mg | ORAL_CAPSULE | ORAL | 0 refills | Status: DC
  Filled 2023-07-02 – 2023-07-28 (×2): qty 30, 30d supply, fill #0

## 2023-07-02 MED ORDER — METHYLPHENIDATE HCL ER (CD) 20 MG PO CPCR: 20.0000 mg | ORAL_CAPSULE | ORAL | 0 refills | Status: DC

## 2023-07-02 NOTE — Progress Notes (Unsigned)
 [facility] psychotherapy note  Name: Stephen Hartman Date: 07/02/2023 MRN: [id number] DOB: 12/08/1985 PCP: Maurice Small, MD (Inactive)  Time spent:  50 minutes  Time in: 10: 00 a.m. time out 10: 50 AM   Treatment:  ind. therapy  Mental Status Exam:    Appearance:    Casual     Behavior:   Appropriate  Motor:   WNL  Speech/Language:    Clear and Coherent  Affect:   Full range   Mood:   euthymic  Thought process:   Logical, linear, goal directed  Thought content:     WNL  Sensory/Perceptual disturbances:     none  Orientation:   x4  Attention:   Good  Concentration:   Good  Memory:   Intact  Fund of knowledge:    Consistent with age and development  Insight:     Good  Judgment:    Good  Impulse Control:   Good     Reported Symptoms:  history of mood swings, impulsivity, distractible, problems with focus  Risk Assessment: Danger to Self:  No Self-injurious Behavior: No Danger to Others: No Duty to Warn:no Physical Aggression / Violence:No  Access to Firearms a concern: no Gang Involvement:No  Patient / guardian was educated about steps to take if suicide or homicide risk level increases between visits: yes While future psychiatric events cannot be accurately predicted, the patient does not currently require acute inpatient psychiatric care and does not currently meet [facility] involuntary commitment criteria.   Medications: Current Outpatient Medications  Medication Sig Dispense Refill   buPROPion (WELLBUTRIN XL) 150 MG 24 hr tablet Take 150 mg by mouth daily.     buPROPion (WELLBUTRIN XL) 150 MG 24 hr tablet TAKE 3 TABLETS BY MOUTH EVERY MORNING 270 tablet 1   buPROPion (WELLBUTRIN XL) 150 MG 24 hr tablet TAKE 3 TABLETS BY MOUTH EVERY MORNING 270 tablet 1   buPROPion (WELLBUTRIN XL) 150 MG 24 hr tablet Take 3 tablets by mouth every morning 270 tablet 1   buPROPion (WELLBUTRIN XL) 150 MG 24 hr tablet Take 3 tablets (450 mg total) by mouth in the  morning. 270 tablet 1   buPROPion (WELLBUTRIN XL) 150 MG 24 hr tablet Take 3 tablets by mouth every morning. 270 tablet 1   buPROPion (WELLBUTRIN XL) 150 MG 24 hr tablet Take 3 tablets (450 mg total) by mouth in the morning. 270 tablet 0   cyclobenzaprine (FLEXERIL) 10 MG tablet Take 1 tablet by mouth 2 times daily as needed for muscle spasms. 20 tablet 0   gabapentin (NEURONTIN) 300 MG capsule Take 1 capsule by mouth at bedtime. 15 capsule 0   HYDROcodone-acetaminophen (NORCO) 10-325 MG tablet Take 1 tablet by mouth every six to eight hours 15 tablet 0   Insulin Pen Needle (UNIFINE PENTIPS) 32G X 4 MM MISC Use as directed to inject with Saxenda 100 each 4   ketorolac (TORADOL) 10 MG tablet Take 1 tablet  by mouth every 6  hours as needed. 20 tablet 0   lamoTRIgine (LAMICTAL) 100 MG tablet Take 1 tablet (100 mg total) by mouth daily. (Patient taking differently: Take 75 mg by mouth daily.) 30 tablet 3   lamoTRIgine (LAMICTAL) 200 MG tablet TAKE 1 TABLET BY MOUTH AT BEDTIME (IF OUT OF MEDICATION MORE THAN 1 WEEK DO NOT RESTART, CALL OFFICE) (IF RASH DEVELOPS, STOP AND CALL OFFICE 90 tablet 1   lamoTRIgine (LAMICTAL) 200 MG tablet TAKE 1 TABLET BY MOUTH EVERY NIGHT  AT BEDTIME. IF OUT OF LAMICTAL MORE THAN 1 WEEK DO NOT RESTART, CALL OFFICE. IF RASH DEVELOPS, STOP MED AND CALL MD 90 tablet 1   lamoTRIgine (LAMICTAL) 200 MG tablet Take 1 tablet by mouth at bedtime (if out of lamictal more than 1 week do not restart, call office)(if rash develops, stop med and call office) 90 tablet 1   lamoTRIgine (LAMICTAL) 200 MG tablet Take 1 tablet by mouth at bedtime (if out of lamictal more than 1 wk do not restart, call office)(if rash develops, stop med and call office) 90 tablet 1   lamoTRIgine (LAMICTAL) 200 MG tablet Take 1 tablet (200 mg total) by mouth at bedtime.  (if out of lamictal more than 1 week do not restart, call office)(if rash develops, stop med and call office) 90 tablet 0   lamoTRIgine (LAMICTAL)  200 MG tablet Take 1 tablet by mouth at bedtime (if out of lamictal more than 1 week do not restart, call office)(if rash develops, stop med and call office) 90 tablet 1   lansoprazole (PREVACID) 30 MG capsule      lansoprazole (PREVACID) 30 MG capsule Take 1 capsule (30 mg total) by mouth daily. 90 capsule 3   lansoprazole (PREVACID) 30 MG capsule Take 1 capsule (30 mg total) by mouth daily. 30 capsule 11   Liraglutide -Weight Management (SAXENDA) 18 MG/3ML SOPN Inject 0.6 mg once daily for a week, then increase to 1.2 mg week two, 1.8 mg week three, 2.4 mg week four, and finally 3 mg as tolerated. 15 mL 8   Liraglutide -Weight Management (SAXENDA) 18 MG/3ML SOPN Inject 3 mg into the skin once a day 15 mL 8   Liraglutide -Weight Management (SAXENDA) 18 MG/3ML SOPN Inject 3 mg into the skin daily. 15 mL 0   methocarbamol (ROBAXIN) 500 MG tablet Take 1 tablet by mouth every 6 to 8 hours as needed for spasm. 40 tablet 0   methylphenidate (METADATE CD) 20 MG CR capsule Take 1 capsule by mouth daily 30 capsule 0   methylphenidate (METADATE CD) 20 MG CR capsule Take 1 capsule (20 mg total) by mouth daily. 30 capsule 0   predniSONE (DELTASONE) 20 MG tablet Take 3 tablets every morning for 2 days, then 2 tablets every morning for 2 days, then 1 tablet every morning for 2 days. 12 tablet 0   Vilazodone HCl 20 MG TABS Take 1 tablet (20 mg total) by mouth daily after breakfast. 90 tablet 1   Vitamin D, Ergocalciferol, 50000 units CAPS Take 50,000 Units (1 capsule) by mouth once a week. 12 capsule 0   No current facility-administered medications for this visit.    Allergies  Allergen Reactions   Erythromycin Nausea And Vomiting   Subjective: Patient arrived on time for today's session.   Patient shared progress, how he continues to struggle with completing tasks at home.  He stated that he tries to implement structure but struggles, is more successful with keeping up with other aspects of the household  such as getting his daughters to appointments, school.  He plans to try to continue to work on staying organized and schedule his day to be more effective.  He went on to share more details regarding his marital relationship, challenges they have had over the years that relate to intimacy.  He also shared his feelings regarding his sexual orientation which he has not shared with anyone, including his wife.  Provide space for patient to detail and process feelings related.  Facilitated his further sharing challenges in his marital relationship as he identified being focused on his family.      Interventions: Supportive therapy, motivational interviewing  Diagnoses:  No diagnosis found.      Plan: Patient is to use coping skills as identified in session to improve emotional regulation, impulses and communication in his marital relationship.  Patient to utilize his support system.  Long-term goal:   To improve his communication in his marital relationship, to allow himself to identify and process feelings. To improve consistency with completing tasks at home.  Short-term goal:  To improve communication in his marriage, specifically being honest and open about his feelings and avoid lying behaviors. To increase consistency with completing household tasks by creating and adhering to a daily schedule.   Waldron Session, [facility]

## 2023-07-12 ENCOUNTER — Other Ambulatory Visit (HOSPITAL_COMMUNITY): Payer: Self-pay

## 2023-07-16 ENCOUNTER — Ambulatory Visit: Admitting: Mental Health

## 2023-07-21 DIAGNOSIS — M545 Low back pain, unspecified: Secondary | ICD-10-CM | POA: Diagnosis not present

## 2023-07-27 ENCOUNTER — Other Ambulatory Visit (HOSPITAL_COMMUNITY): Payer: Self-pay

## 2023-07-27 MED ORDER — CYCLOBENZAPRINE HCL 5 MG PO TABS
5.0000 mg | ORAL_TABLET | Freq: Three times a day (TID) | ORAL | 0 refills | Status: AC | PRN
  Filled 2023-07-27: qty 30, 10d supply, fill #0

## 2023-07-28 ENCOUNTER — Other Ambulatory Visit: Payer: Self-pay | Admitting: Behavioral Health

## 2023-07-28 ENCOUNTER — Other Ambulatory Visit (HOSPITAL_COMMUNITY): Payer: Self-pay

## 2023-07-28 ENCOUNTER — Other Ambulatory Visit: Payer: Self-pay

## 2023-07-28 DIAGNOSIS — F902 Attention-deficit hyperactivity disorder, combined type: Secondary | ICD-10-CM

## 2023-07-29 NOTE — Telephone Encounter (Signed)
 Pt notified it would be mid-May before medication will be in stock. He will call around and see if he can find at another pharmacy and let us  know.

## 2023-08-04 ENCOUNTER — Other Ambulatory Visit (HOSPITAL_COMMUNITY): Payer: Self-pay

## 2023-08-06 ENCOUNTER — Other Ambulatory Visit (HOSPITAL_COMMUNITY): Payer: Self-pay

## 2023-08-11 DIAGNOSIS — G4733 Obstructive sleep apnea (adult) (pediatric): Secondary | ICD-10-CM | POA: Diagnosis not present

## 2023-08-16 ENCOUNTER — Ambulatory Visit: Admitting: Mental Health

## 2023-08-16 ENCOUNTER — Other Ambulatory Visit (HOSPITAL_COMMUNITY): Payer: Self-pay

## 2023-08-17 ENCOUNTER — Other Ambulatory Visit (HOSPITAL_COMMUNITY): Payer: Self-pay

## 2023-08-17 ENCOUNTER — Ambulatory Visit: Payer: Commercial Managed Care - PPO | Admitting: Behavioral Health

## 2023-08-17 NOTE — Progress Notes (Signed)
 Pt did not show for schedule appt and did not provide 24 hour notice as required. Additional fees to be assessed.

## 2023-08-18 ENCOUNTER — Encounter: Payer: Self-pay | Admitting: Behavioral Health

## 2023-08-18 ENCOUNTER — Ambulatory Visit (INDEPENDENT_AMBULATORY_CARE_PROVIDER_SITE_OTHER): Admitting: Behavioral Health

## 2023-08-18 DIAGNOSIS — F3181 Bipolar II disorder: Secondary | ICD-10-CM | POA: Diagnosis not present

## 2023-08-18 DIAGNOSIS — F411 Generalized anxiety disorder: Secondary | ICD-10-CM | POA: Diagnosis not present

## 2023-08-18 DIAGNOSIS — F902 Attention-deficit hyperactivity disorder, combined type: Secondary | ICD-10-CM | POA: Diagnosis not present

## 2023-08-18 MED ORDER — BUPROPION HCL ER (XL) 150 MG PO TB24: 450.0000 mg | ORAL_TABLET | Freq: Every morning | ORAL | 0 refills | Status: DC

## 2023-08-18 MED ORDER — LAMOTRIGINE 200 MG PO TABS: ORAL_TABLET | ORAL | 1 refills | Status: AC

## 2023-08-18 MED ORDER — VILAZODONE HCL 20 MG PO TABS: 20.0000 mg | ORAL_TABLET | Freq: Every day | ORAL | 1 refills | Status: DC

## 2023-08-18 MED ORDER — METHYLPHENIDATE HCL ER (CD) 20 MG PO CPCR
20.0000 mg | ORAL_CAPSULE | ORAL | 0 refills | Status: AC
Start: 2023-08-18 — End: 2023-09-17

## 2023-08-18 NOTE — Progress Notes (Signed)
 [facility]  Patient ID: Stephen Hartman,  MRN: [id number]  PCP: Norlene Beavers, MD (Inactive)  Date of Evaluation: 08/18/2023 Time spent:30 minutes  Chief Complaint:  Chief Complaint   ADHD; Depression; Anxiety; Follow-up; Patient Education; Medication Refill     HISTORY/CURRENT STATUS: HPI "Stephen Hartman", 38 year old male presents to this office for follow up and medication management.  No psychosocial changes this visit. Says he continues to feel much better since starting the Viibryd . He has had no recent SI. He does not want to adjust medications this visit.   He is currently rating his depression today at 2/10, and anxiety at 2/10.  He is sleeping 7 to 8 hours per night.    States that he has strong family support and that he feels safe. No mania, no psychosis, no auditory or visual hallucinations. No SI or HI    Past psychiatric medication trials: Lamictal  Wellbutrin  Methylphenidate  Adderall Strattera  Citalopram  Individual Medical History/ Review of Systems: Changes? :No   Allergies: Erythromycin  Current Medications:  Current Outpatient Medications:    buPROPion  (WELLBUTRIN  XL) 150 MG 24 hr tablet, Take 3 tablets (450 mg total) by mouth in the morning., Disp: 270 tablet, Rfl: 0   cyclobenzaprine  (FLEXERIL ) 5 MG tablet, Take 1-2 tablets (5-10 mg total) by mouth up to 3 (three) times daily as needed., Disp: 30 tablet, Rfl: 0   gabapentin  (NEURONTIN ) 300 MG capsule, Take 1 capsule by mouth at bedtime., Disp: 15 capsule, Rfl: 0   HYDROcodone -acetaminophen  (NORCO) 10-325 MG tablet, Take 1 tablet by mouth every six to eight hours, Disp: 15 tablet, Rfl: 0   Insulin  Pen Needle (UNIFINE PENTIPS) 32G X 4 MM MISC, Use as directed to inject with Saxenda , Disp: 100 each, Rfl: 4   ketorolac  (TORADOL ) 10 MG tablet, Take 1 tablet  by mouth every 6  hours as needed., Disp: 20 tablet, Rfl: 0   lamoTRIgine  (LAMICTAL ) 200 MG tablet, Take 1 tablet by mouth at bedtime (if out of  lamictal  more than 1 week do not restart, call office)(if rash develops, stop med and call office), Disp: 90 tablet, Rfl: 1   lamoTRIgine  (LAMICTAL ) 200 MG tablet, TAKE 1 TABLET BY MOUTH EVERY NIGHT AT BEDTIME. IF OUT OF LAMICTAL  MORE THAN 1 WEEK DO NOT RESTART, CALL OFFICE. IF RASH DEVELOPS, STOP MED AND CALL MD, Disp: 90 tablet, Rfl: 1   lansoprazole  (PREVACID ) 30 MG capsule, , Disp: , Rfl:    lansoprazole  (PREVACID ) 30 MG capsule, Take 1 capsule (30 mg total) by mouth daily., Disp: 90 capsule, Rfl: 3   lansoprazole  (PREVACID ) 30 MG capsule, Take 1 capsule (30 mg total) by mouth daily., Disp: 30 capsule, Rfl: 11   Liraglutide  -Weight Management (SAXENDA ) 18 MG/3ML SOPN, Inject 0.6 mg once daily for a week, then increase to 1.2 mg week two, 1.8 mg week three, 2.4 mg week four, and finally 3 mg as tolerated., Disp: 15 mL, Rfl: 8   Liraglutide  -Weight Management (SAXENDA ) 18 MG/3ML SOPN, Inject 3 mg into the skin once a day, Disp: 15 mL, Rfl: 8   Liraglutide  -Weight Management (SAXENDA ) 18 MG/3ML SOPN, Inject 3 mg into the skin daily., Disp: 15 mL, Rfl: 0   methylphenidate  (METADATE  CD) 20 MG CR capsule, Take 1 capsule (20 mg total) by mouth every morning., Disp: 30 capsule, Rfl: 0   predniSONE  (DELTASONE ) 20 MG tablet, Take 3 tablets every morning for 2 days, then 2 tablets every morning for 2 days, then 1 tablet every morning for 2  days., Disp: 12 tablet, Rfl: 0   Vilazodone  HCl 20 MG TABS, Take 1 tablet (20 mg total) by mouth daily after breakfast., Disp: 90 tablet, Rfl: 1   Vitamin D , Ergocalciferol , 50000 units CAPS, Take 50,000 Units (1 capsule) by mouth once a week., Disp: 12 capsule, Rfl: 0 Medication Side Effects: none  Family Medical/ Social History: Changes? No  MENTAL HEALTH EXAM:  There were no vitals taken for this visit.There is no height or weight on file to calculate BMI.  General Appearance: Casual, Neat, and Well Groomed  Eye Contact:  Good  Speech:  Clear and Coherent   Volume:  Normal  Mood:  NA  Affect:  Appropriate  Thought Process:  Coherent  Orientation:  Full (Time, Place, and Person)  Thought Content: Logical   Suicidal Thoughts:  No  Homicidal Thoughts:  No  Memory:  WNL  Judgement:  Good  Insight:  Good  Psychomotor Activity:  Normal  Concentration:  Concentration: Good  Recall:  Good  Fund of Knowledge: Good  Language: Good  Assets:  Desire for Improvement  ADL's:  Intact  Cognition: WNL  Prognosis:  Good    DIAGNOSES:    ICD-10-CM   1. Attention deficit hyperactivity disorder (ADHD), combined type  F90.2 methylphenidate  (METADATE  CD) 20 MG CR capsule    2. Bipolar 2 disorder (HCC)  F31.81 buPROPion  (WELLBUTRIN  XL) 150 MG 24 hr tablet    Vilazodone  HCl 20 MG TABS    3. Generalized anxiety disorder  F41.1 buPROPion  (WELLBUTRIN  XL) 150 MG 24 hr tablet    Vilazodone  HCl 20 MG TABS      Receiving Psychotherapy: yes   RECOMMENDATIONS:   Greater than 50% of 30  min face to face time with patient was spent on counseling and coordination of care. We discussed his report of significant improvement. He likes his medication and is requesting no changes today. Reinforced importance of regular Regular BP checks at home. Monitor weights weekly. He is practicing good sleep hygiene.  We agreed today to: To continue Wellbutrin  450 mg XL daily. Pt taking three 150 mg tablets per day at same time.  To continue Lamictal  200 mg To continue Viibryd  20 mg daily.  Will report side effects of worsening symptoms promptly To follow-up in 12 weeks to reassess Provided emergency contact information Reviewed PDMP       Lincoln Renshaw, NP

## 2023-08-18 NOTE — Addendum Note (Signed)
 Addended by: Marita Sidle A on: 08/18/2023 09:35 AM   Modules accepted: Level of Service

## 2023-08-23 ENCOUNTER — Other Ambulatory Visit (HOSPITAL_COMMUNITY): Payer: Self-pay

## 2023-08-23 MED ORDER — LANSOPRAZOLE 30 MG PO CPDR
30.0000 mg | DELAYED_RELEASE_CAPSULE | Freq: Every day | ORAL | 0 refills | Status: DC
  Filled 2023-08-23: qty 30, 30d supply, fill #0

## 2023-08-24 ENCOUNTER — Other Ambulatory Visit (HOSPITAL_COMMUNITY): Payer: Self-pay

## 2023-08-31 DIAGNOSIS — G4733 Obstructive sleep apnea (adult) (pediatric): Secondary | ICD-10-CM | POA: Diagnosis not present

## 2023-09-11 ENCOUNTER — Other Ambulatory Visit (HOSPITAL_COMMUNITY): Payer: Self-pay

## 2023-09-11 ENCOUNTER — Emergency Department (HOSPITAL_COMMUNITY)
Admission: EM | Admit: 2023-09-11 | Discharge: 2023-09-11 | Disposition: A | Discharge status: Home / Self Care | Attending: Emergency Medicine | Admitting: Emergency Medicine

## 2023-09-11 ENCOUNTER — Other Ambulatory Visit: Payer: Self-pay

## 2023-09-11 ENCOUNTER — Emergency Department (HOSPITAL_COMMUNITY): Payer: Self-pay

## 2023-09-11 DIAGNOSIS — S0502XA Injury of conjunctiva and corneal abrasion without foreign body, left eye, initial encounter: Secondary | ICD-10-CM | POA: Diagnosis not present

## 2023-09-11 DIAGNOSIS — S0993XA Unspecified injury of face, initial encounter: Secondary | ICD-10-CM | POA: Diagnosis present

## 2023-09-11 DIAGNOSIS — Y990 Civilian activity done for income or pay: Secondary | ICD-10-CM | POA: Diagnosis not present

## 2023-09-11 MED ORDER — IBUPROFEN 800 MG PO TABS
800.0000 mg | ORAL_TABLET | Freq: Once | ORAL | Status: AC
  Administered 2023-09-11: 800 mg via ORAL
  Filled 2023-09-11: qty 1

## 2023-09-11 MED ORDER — ERYTHROMYCIN 5 MG/GM OP OINT
TOPICAL_OINTMENT | OPHTHALMIC | 0 refills | Status: AC
Start: 2023-09-11 — End: ?
  Filled 2023-09-11: qty 3.5, 5d supply, fill #0

## 2023-09-11 MED ORDER — FLUORESCEIN SODIUM 1 MG OP STRP
1.0000 | ORAL_STRIP | Freq: Once | OPHTHALMIC | Status: AC
  Administered 2023-09-11: 1 via OPHTHALMIC
  Filled 2023-09-11: qty 1

## 2023-09-11 MED ORDER — TETRACAINE HCL 0.5 % OP SOLN
1.0000 [drp] | Freq: Once | OPHTHALMIC | Status: AC
  Administered 2023-09-11: 1 [drp] via OPHTHALMIC
  Filled 2023-09-11: qty 4
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
## 2023-09-11 NOTE — Discharge Instructions (Addendum)
 Please follow-up with your Primary care doctor or your ophthalmologist, if your eye is not improving.  Return if you feel like your symptoms are worsening.  I recommend placing ice on the area, for 20 minutes on and 20 minutes off. If you have loss of vision, confusion, please return to the ED.

## 2023-09-13 ENCOUNTER — Other Ambulatory Visit (HOSPITAL_COMMUNITY): Payer: Self-pay

## 2023-09-15 ENCOUNTER — Other Ambulatory Visit (HOSPITAL_COMMUNITY): Payer: Self-pay

## 2023-09-15 ENCOUNTER — Encounter (HOSPITAL_COMMUNITY): Payer: Self-pay | Admitting: *Deleted

## 2023-09-15 ENCOUNTER — Ambulatory Visit (HOSPITAL_COMMUNITY)
Admission: EM | Admit: 2023-09-15 | Discharge: 2023-09-15 | Disposition: A | Discharge status: Home / Self Care | Payer: Worker's Compensation | Attending: Emergency Medicine | Admitting: Emergency Medicine

## 2023-09-15 DIAGNOSIS — R519 Headache, unspecified: Secondary | ICD-10-CM | POA: Diagnosis not present

## 2023-09-15 DIAGNOSIS — I10 Essential (primary) hypertension: Secondary | ICD-10-CM | POA: Diagnosis not present

## 2023-09-15 MED ORDER — AMLODIPINE BESYLATE 5 MG PO TABS
5.0000 mg | ORAL_TABLET | Freq: Every day | ORAL | 1 refills | Status: AC
  Filled 2023-09-15: qty 30, 30d supply, fill #0
  Filled 2024-01-13: qty 30, 30d supply, fill #1
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
## 2023-09-15 NOTE — Discharge Instructions (Signed)
 Starting 1 tablet of amlodipine once daily.  I have provided you with 30-day supply with 1 refill. Call your primary care provider first thing tomorrow morning to schedule a follow-up appointment regarding blood pressure management. If your vision continues to be an issue I recommend following up with your ophthalmologist for further evaluation of this. If you develop worsening headache, severe dizziness, loss of consciousness, worsening blurred vision, weakness, numbness, confusion, or slurred speech please seek immediate medical treatment in the emergency department.

## 2023-09-22 ENCOUNTER — Other Ambulatory Visit (HOSPITAL_COMMUNITY): Payer: Self-pay

## 2023-09-22 ENCOUNTER — Other Ambulatory Visit: Payer: Self-pay

## 2023-09-22 DIAGNOSIS — I10 Essential (primary) hypertension: Secondary | ICD-10-CM | POA: Diagnosis not present

## 2023-09-22 DIAGNOSIS — E559 Vitamin D deficiency, unspecified: Secondary | ICD-10-CM | POA: Diagnosis not present

## 2023-09-22 DIAGNOSIS — G4733 Obstructive sleep apnea (adult) (pediatric): Secondary | ICD-10-CM | POA: Diagnosis not present

## 2023-09-22 DIAGNOSIS — Z0189 Encounter for other specified special examinations: Secondary | ICD-10-CM | POA: Diagnosis not present

## 2023-09-22 DIAGNOSIS — K219 Gastro-esophageal reflux disease without esophagitis: Secondary | ICD-10-CM | POA: Diagnosis not present

## 2023-09-22 DIAGNOSIS — F329 Major depressive disorder, single episode, unspecified: Secondary | ICD-10-CM | POA: Diagnosis not present

## 2023-09-22 DIAGNOSIS — M79662 Pain in left lower leg: Secondary | ICD-10-CM | POA: Diagnosis not present

## 2023-09-22 MED ORDER — ZEPBOUND 2.5 MG/0.5ML ~~LOC~~ SOAJ
2.5000 mg | SUBCUTANEOUS | 0 refills | Status: AC
  Filled 2023-09-22: qty 2, 28d supply, fill #0

## 2023-09-22 MED ORDER — LANSOPRAZOLE 30 MG PO CPDR
30.0000 mg | DELAYED_RELEASE_CAPSULE | Freq: Every day | ORAL | 3 refills | Status: DC
  Filled 2023-09-22: qty 30, 30d supply, fill #0
  Filled 2023-10-23: qty 30, 30d supply, fill #1

## 2023-09-22 MED ORDER — AMLODIPINE BESYLATE 5 MG PO TABS
5.0000 mg | ORAL_TABLET | Freq: Every day | ORAL | 3 refills | Status: AC
  Filled 2023-09-24: qty 100, 100d supply, fill #0
  Filled 2023-10-12: qty 90, 90d supply, fill #0
  Filled 2024-01-13: qty 90, 90d supply, fill #1
  Filled 2024-04-12: qty 90, 90d supply, fill #2

## 2023-09-23 ENCOUNTER — Other Ambulatory Visit (HOSPITAL_COMMUNITY): Payer: Self-pay

## 2023-09-23 MED ORDER — ERGOCALCIFEROL 1.25 MG (50000 UT) PO CAPS
50000.0000 [IU] | ORAL_CAPSULE | ORAL | 1 refills | Status: AC
  Filled 2023-09-23: qty 12, 84d supply, fill #0
  Filled 2023-12-23: qty 12, 84d supply, fill #1

## 2023-09-24 ENCOUNTER — Other Ambulatory Visit (HOSPITAL_COMMUNITY): Payer: Self-pay

## 2023-10-07 ENCOUNTER — Other Ambulatory Visit (HOSPITAL_COMMUNITY): Payer: Self-pay

## 2023-10-12 ENCOUNTER — Other Ambulatory Visit: Payer: Self-pay | Admitting: Behavioral Health

## 2023-10-12 ENCOUNTER — Other Ambulatory Visit (HOSPITAL_COMMUNITY): Payer: Self-pay

## 2023-10-12 DIAGNOSIS — F411 Generalized anxiety disorder: Secondary | ICD-10-CM

## 2023-10-12 DIAGNOSIS — F3181 Bipolar II disorder: Secondary | ICD-10-CM

## 2023-10-12 DIAGNOSIS — F902 Attention-deficit hyperactivity disorder, combined type: Secondary | ICD-10-CM

## 2023-10-13 ENCOUNTER — Other Ambulatory Visit (HOSPITAL_COMMUNITY): Payer: Self-pay

## 2023-10-13 ENCOUNTER — Other Ambulatory Visit: Payer: Self-pay

## 2023-10-13 MED ORDER — METHYLPHENIDATE HCL ER (CD) 20 MG PO CPCR
20.0000 mg | ORAL_CAPSULE | Freq: Every day | ORAL | 0 refills | Status: DC
  Filled 2023-10-13: qty 30, 30d supply, fill #0

## 2023-10-14 ENCOUNTER — Other Ambulatory Visit (HOSPITAL_COMMUNITY): Payer: Self-pay

## 2023-10-15 ENCOUNTER — Other Ambulatory Visit (HOSPITAL_COMMUNITY): Payer: Self-pay

## 2023-10-15 MED ORDER — BUPROPION HCL ER (XL) 150 MG PO TB24
450.0000 mg | ORAL_TABLET | Freq: Every day | ORAL | 1 refills | Status: AC
  Filled 2023-10-23: qty 270, 90d supply, fill #0
  Filled 2024-01-25: qty 270, 90d supply, fill #1

## 2023-10-18 ENCOUNTER — Other Ambulatory Visit (HOSPITAL_COMMUNITY): Payer: Self-pay

## 2023-10-22 ENCOUNTER — Other Ambulatory Visit (HOSPITAL_COMMUNITY): Payer: Self-pay

## 2023-10-23 ENCOUNTER — Other Ambulatory Visit (HOSPITAL_COMMUNITY): Payer: Self-pay

## 2023-10-23 MED ORDER — LANSOPRAZOLE 15 MG PO CPDR
30.0000 mg | DELAYED_RELEASE_CAPSULE | Freq: Every day | ORAL | 0 refills | Status: AC
  Filled 2023-10-23: qty 60, 30d supply, fill #0
  Filled 2023-12-23: qty 60, 30d supply, fill #1
  Filled 2024-01-25: qty 60, 30d supply, fill #2
  Filled 2024-02-21: qty 60, 30d supply, fill #3
  Filled 2024-03-20: qty 60, 30d supply, fill #4

## 2023-10-30 ENCOUNTER — Other Ambulatory Visit (HOSPITAL_COMMUNITY): Payer: Self-pay

## 2023-11-18 ENCOUNTER — Ambulatory Visit: Admitting: Behavioral Health

## 2023-11-19 ENCOUNTER — Other Ambulatory Visit (HOSPITAL_COMMUNITY): Payer: Self-pay

## 2023-11-19 ENCOUNTER — Ambulatory Visit (INDEPENDENT_AMBULATORY_CARE_PROVIDER_SITE_OTHER): Admitting: Behavioral Health

## 2023-11-19 ENCOUNTER — Encounter: Payer: Self-pay | Admitting: Behavioral Health

## 2023-11-19 DIAGNOSIS — F3181 Bipolar II disorder: Secondary | ICD-10-CM

## 2023-11-19 DIAGNOSIS — F902 Attention-deficit hyperactivity disorder, combined type: Secondary | ICD-10-CM

## 2023-11-19 DIAGNOSIS — F411 Generalized anxiety disorder: Secondary | ICD-10-CM

## 2023-11-19 MED ORDER — BUPROPION HCL ER (XL) 150 MG PO TB24
450.0000 mg | ORAL_TABLET | Freq: Every morning | ORAL | 1 refills | Status: DC
  Filled 2023-11-19 – 2024-04-12 (×4): qty 270, 90d supply, fill #0

## 2023-11-19 MED ORDER — VILAZODONE HCL 20 MG PO TABS
20.0000 mg | ORAL_TABLET | Freq: Every day | ORAL | 2 refills | Status: DC
  Filled 2023-11-19: qty 90, 90d supply, fill #0
  Filled 2024-02-21: qty 90, 90d supply, fill #1

## 2023-11-19 MED ORDER — LAMOTRIGINE 200 MG PO TABS
200.0000 mg | ORAL_TABLET | Freq: Every evening | ORAL | 2 refills | Status: DC
  Filled 2023-11-19 – 2023-12-23 (×2): qty 90, 90d supply, fill #0
  Filled 2024-03-20: qty 90, 90d supply, fill #1

## 2023-11-19 MED ORDER — METHYLPHENIDATE HCL ER (CD) 20 MG PO CPCR
20.0000 mg | ORAL_CAPSULE | Freq: Every day | ORAL | 0 refills | Status: AC
  Filled 2023-11-19: qty 30, 30d supply, fill #0

## 2023-11-19 MED ORDER — METHYLPHENIDATE HCL ER (CD) 20 MG PO CPCR
20.0000 mg | ORAL_CAPSULE | ORAL | 0 refills | Status: DC
  Filled 2023-12-23: qty 30, 30d supply, fill #0

## 2023-11-19 NOTE — Progress Notes (Signed)
 [facility]  Patient ID: Stephen Hartman,  MRN: [id number]  PCP: Stephen Cole, MD  Date of Evaluation: 11/19/2023 Time spent:30 minutes  Chief Complaint:  Chief Complaint   Depression; Anxiety; Follow-up; Medication Refill; Patient Education; ADHD     HISTORY/CURRENT STATUS: HPI Stephen Hartman, 38 year old male presents to this office for follow up and medication management.  No psychosocial changes this visit. Continues with good stability. Has no questions or concerns today. Needing regular refills.  He has had no recent SI. He does not want to adjust medications this visit.   He is currently rating his depression today at 2/10, and anxiety at 2/10.  He is sleeping 7 to 8 hours per night.    States that he has strong family support and that he feels safe. No mania, no psychosis, no auditory or visual hallucinations. No SI or HI    Past psychiatric medication trials: Lamictal  Wellbutrin  Methylphenidate  Adderall Strattera  Citalopram  Individual Medical History/ Review of Systems: Changes? :No   Allergies: Erythromycin   Current Medications:  Current Outpatient Medications:    [START ON 12/19/2023] methylphenidate  (METADATE  CD) 20 MG CR capsule, Take 1 capsule (20 mg total) by mouth every morning., Disp: 30 capsule, Rfl: 0   amLODipine  (NORVASC ) 5 MG tablet, Take 1 tablet (5 mg total) by mouth daily., Disp: 30 tablet, Rfl: 1   amLODipine  (NORVASC ) 5 MG tablet, Take 1 tablet (5 mg total) by mouth daily., Disp: 100 tablet, Rfl: 3   buPROPion  (WELLBUTRIN  XL) 150 MG 24 hr tablet, Take 3 tablets (450 mg total) by mouth daily in the morning., Disp: 270 tablet, Rfl: 1   buPROPion  (WELLBUTRIN  XL) 150 MG 24 hr tablet, Take 3 tablets (450 mg total) by mouth in the morning., Disp: 270 tablet, Rfl: 1   cyclobenzaprine  (FLEXERIL ) 5 MG tablet, Take 1-2 tablets (5-10 mg total) by mouth up to 3 (three) times daily as needed., Disp: 30 tablet, Rfl: 0   ergocalciferol  (VITAMIN D2) 1.25 MG (50000  UT) capsule, Take 1 capsule (50,000 Units total) by mouth once a week., Disp: 12 capsule, Rfl: 1   erythromycin  ophthalmic ointment, Place a 1/2 inch ribbon of ointment into the lower eyelid 4 times daily for 5 days., Disp: 3.5 g, Rfl: 0   Insulin  Pen Needle (UNIFINE PENTIPS) 32G X 4 MM MISC, Use as directed to inject with Saxenda , Disp: 100 each, Rfl: 4   lamoTRIgine  (LAMICTAL ) 200 MG tablet, TAKE 1 TABLET BY MOUTH EVERY NIGHT AT BEDTIME. IF OUT OF LAMICTAL  MORE THAN 1 WEEK DO NOT RESTART, CALL OFFICE. IF RASH DEVELOPS, STOP MED AND CALL MD, Disp: 90 tablet, Rfl: 1   lamoTRIgine  (LAMICTAL ) 200 MG tablet, Take 1 tablet by mouth at bedtime (if out of lamictal  more than 1 week do not restart, call office)(if rash develops, stop med and call office), Disp: 90 tablet, Rfl: 2   lansoprazole  (PREVACID ) 15 MG capsule, Take 2 capsules (30 mg total) by mouth daily., Disp: 720 capsule, Rfl: 0   lansoprazole  (PREVACID ) 30 MG capsule, , Disp: , Rfl:    lansoprazole  (PREVACID ) 30 MG capsule, Take 1 capsule (30 mg total) by mouth daily., Disp: 30 capsule, Rfl: 11   methylphenidate  (METADATE  CD) 20 MG CR capsule, Take 1 capsule (20 mg total) by mouth every morning., Disp: 30 capsule, Rfl: 0   methylphenidate  (METADATE  CD) 20 MG CR capsule, Take 1 capsule (20 mg total) by mouth daily., Disp: 30 capsule, Rfl: 0   tirzepatide  (ZEPBOUND ) 2.5 MG/0.5ML Pen, Inject  2.5 mg into the skin once a week., Disp: 2 mL, Rfl: 0   Vilazodone  HCl 20 MG TABS, Take 1 tablet (20 mg total) by mouth daily after breakfast., Disp: 90 tablet, Rfl: 2 Medication Side Effects: none  Family Medical/ Social History: Changes? No  MENTAL HEALTH EXAM:  There were no vitals taken for this visit.There is no height or weight on file to calculate BMI.  General Appearance: Casual and Neat  Eye Contact:  Good  Speech:  Clear and Coherent  Volume:  Normal  Mood:  NA  Affect:  Appropriate  Thought Process:  Coherent  Orientation:  Full (Time,  Place, and Person)  Thought Content: Logical   Suicidal Thoughts:  No  Homicidal Thoughts:  No  Memory:  WNL  Judgement:  Good  Insight:  Good  Psychomotor Activity:  Normal  Concentration:  Concentration: Good  Recall:  Good  Fund of Knowledge: Good  Language: Good  Assets:  Desire for Improvement  ADL's:  Intact  Cognition: WNL  Prognosis:  Good    DIAGNOSES:    ICD-10-CM   1. Bipolar 2 disorder (HCC)  F31.81 lamoTRIgine  (LAMICTAL ) 200 MG tablet    buPROPion  (WELLBUTRIN  XL) 150 MG 24 hr tablet    Vilazodone  HCl 20 MG TABS    2. Generalized anxiety disorder  F41.1 buPROPion  (WELLBUTRIN  XL) 150 MG 24 hr tablet    Vilazodone  HCl 20 MG TABS    3. Attention deficit hyperactivity disorder (ADHD), combined type  F90.2 methylphenidate  (METADATE  CD) 20 MG CR capsule      Receiving Psychotherapy: No    RECOMMENDATIONS:  Greater than 50% of 30  min face to face time with patient was spent on counseling and coordination of care. We discussed his report of significant improvement. He likes his medication and is requesting no changes today. Reinforced importance of regular Regular BP checks at home. Now on Amlodipine  5 mg daily.   Monitor weights weekly. He is practicing good sleep hygiene.  We agreed today to: To continue Wellbutrin  450 mg XL daily. Pt taking three 150 mg tablets per day at same time.  To continue Lamictal  200 mg To continue Viibryd  20 mg daily.  Will report side effects of worsening symptoms promptly To follow-up in 16 weeks to reassess Provided emergency contact information Reviewed PDMP    [name], NP

## 2023-11-23 ENCOUNTER — Other Ambulatory Visit (HOSPITAL_COMMUNITY): Payer: Self-pay

## 2023-12-23 ENCOUNTER — Other Ambulatory Visit (HOSPITAL_COMMUNITY): Payer: Self-pay

## 2024-01-13 ENCOUNTER — Other Ambulatory Visit (HOSPITAL_COMMUNITY): Payer: Self-pay

## 2024-01-25 ENCOUNTER — Other Ambulatory Visit: Payer: Self-pay | Admitting: Behavioral Health

## 2024-01-25 ENCOUNTER — Other Ambulatory Visit (HOSPITAL_COMMUNITY): Payer: Self-pay

## 2024-01-25 DIAGNOSIS — F902 Attention-deficit hyperactivity disorder, combined type: Secondary | ICD-10-CM

## 2024-01-26 ENCOUNTER — Other Ambulatory Visit: Payer: Self-pay

## 2024-01-26 ENCOUNTER — Other Ambulatory Visit (HOSPITAL_COMMUNITY): Payer: Self-pay

## 2024-01-26 MED ORDER — METHYLPHENIDATE HCL ER (CD) 20 MG PO CPCR
20.0000 mg | ORAL_CAPSULE | ORAL | 0 refills | Status: DC
  Filled 2024-01-26: qty 30, 30d supply, fill #0

## 2024-01-27 ENCOUNTER — Other Ambulatory Visit (HOSPITAL_COMMUNITY): Payer: Self-pay

## 2024-02-02 ENCOUNTER — Other Ambulatory Visit (HOSPITAL_COMMUNITY): Payer: Self-pay

## 2024-02-21 ENCOUNTER — Other Ambulatory Visit (HOSPITAL_COMMUNITY): Payer: Self-pay

## 2024-02-21 ENCOUNTER — Other Ambulatory Visit: Payer: Self-pay

## 2024-03-20 ENCOUNTER — Other Ambulatory Visit: Payer: Self-pay

## 2024-03-20 ENCOUNTER — Other Ambulatory Visit: Payer: Self-pay | Admitting: Physician Assistant

## 2024-03-20 ENCOUNTER — Other Ambulatory Visit (HOSPITAL_COMMUNITY): Payer: Self-pay

## 2024-03-20 DIAGNOSIS — F902 Attention-deficit hyperactivity disorder, combined type: Secondary | ICD-10-CM

## 2024-03-20 MED ORDER — METHYLPHENIDATE HCL ER (CD) 20 MG PO CPCR
20.0000 mg | ORAL_CAPSULE | ORAL | 0 refills | Status: DC
  Filled 2024-03-20: qty 30, 30d supply, fill #0

## 2024-04-12 ENCOUNTER — Other Ambulatory Visit (HOSPITAL_COMMUNITY): Payer: Self-pay

## 2024-04-20 ENCOUNTER — Telehealth: Admitting: Behavioral Health

## 2024-04-20 ENCOUNTER — Other Ambulatory Visit: Payer: Self-pay

## 2024-04-20 ENCOUNTER — Other Ambulatory Visit (HOSPITAL_COMMUNITY): Payer: Self-pay

## 2024-04-20 ENCOUNTER — Encounter: Payer: Self-pay | Admitting: Behavioral Health

## 2024-04-20 DIAGNOSIS — F3181 Bipolar II disorder: Secondary | ICD-10-CM | POA: Diagnosis not present

## 2024-04-20 DIAGNOSIS — F411 Generalized anxiety disorder: Secondary | ICD-10-CM

## 2024-04-20 DIAGNOSIS — F902 Attention-deficit hyperactivity disorder, combined type: Secondary | ICD-10-CM | POA: Diagnosis not present

## 2024-04-20 MED ORDER — METHYLPHENIDATE HCL ER (CD) 20 MG PO CPCR
20.0000 mg | ORAL_CAPSULE | ORAL | 0 refills | Status: AC
  Filled 2024-04-20 – 2024-04-25 (×2): qty 30, 30d supply, fill #0

## 2024-04-20 MED ORDER — LAMOTRIGINE 200 MG PO TABS
200.0000 mg | ORAL_TABLET | Freq: Every evening | ORAL | 2 refills | Status: AC
  Filled 2024-04-20: qty 90, 90d supply, fill #0

## 2024-04-20 MED ORDER — VILAZODONE HCL 20 MG PO TABS
20.0000 mg | ORAL_TABLET | Freq: Every day | ORAL | 2 refills | Status: AC
  Filled 2024-04-20: qty 90, 90d supply, fill #0

## 2024-04-20 MED ORDER — BUPROPION HCL ER (XL) 150 MG PO TB24
450.0000 mg | ORAL_TABLET | Freq: Every morning | ORAL | 1 refills | Status: AC
  Filled 2024-04-20: qty 270, 90d supply, fill #0

## 2024-04-20 NOTE — Progress Notes (Signed)
 Stephen Hartman [id number] September 07, 1985 39 y.o.  Virtual Visit via Video Note  I connected with pt @ on 04/20/24 at  1:00 PM EST by a video enabled telemedicine application and verified that I am speaking with the correct person using two identifiers.   I discussed the limitations of evaluation and management by telemedicine and the availability of in person appointments. The patient expressed understanding and agreed to proceed.  I discussed the assessment and treatment plan with the patient. The patient was provided an opportunity to ask questions and all were answered. The patient agreed with the plan and demonstrated an understanding of the instructions.   The patient was advised to call back or seek an in-person evaluation if the symptoms worsen or if the condition fails to improve as anticipated.  I provided 20 minutes of non-face-to-face time during this encounter.  The patient was located at home.  The provider was located at [facility].   Stephen DELENA Pizza, NP   Subjective:   Patient ID:  Stephen Hartman is a 61 y.o. (DOB Nov 18, 1985) male.  Chief Complaint:  Chief Complaint  Patient presents with   Anxiety   Depression   ADHD   Follow-up   Medication Refill   Patient Education    HPI  Stephen Hartman, 39 year old male presents to this office via video for follow up and medication management.  No psychosocial changes this visit. Continues with good stability. Has no questions or concerns today. Needing regular refills.  He has had no recent SI. He does not want to adjust medications this visit.   He is currently rating his depression today at 2/10, and anxiety at 2/10.  He is sleeping 7 to 8 hours per night.    States that he has strong family support and that he feels safe. No mania, no psychosis, no auditory or visual hallucinations. No SI or HI    Past psychiatric medication trials: Lamictal  Wellbutrin  Methylphenidate  Adderall Strattera  Citalopram     Review of  Systems:  Review of Systems  Constitutional: Negative.   Allergic/Immunologic: Negative.   Neurological: Negative.   Psychiatric/Behavioral: Negative.      Medications: I have reviewed the patient's current medications.  Current Outpatient Medications  Medication Sig Dispense Refill   amLODipine  (NORVASC ) 5 MG tablet Take 1 tablet (5 mg total) by mouth daily. 30 tablet 1   amLODipine  (NORVASC ) 5 MG tablet Take 1 tablet (5 mg total) by mouth daily. 100 tablet 3   buPROPion  (WELLBUTRIN  XL) 150 MG 24 hr tablet Take 3 tablets (450 mg total) by mouth daily in the morning. 270 tablet 1   buPROPion  (WELLBUTRIN  XL) 150 MG 24 hr tablet Take 3 tablets (450 mg total) by mouth in the morning. 270 tablet 1   cyclobenzaprine  (FLEXERIL ) 5 MG tablet Take 1-2 tablets (5-10 mg total) by mouth up to 3 (three) times daily as needed. 30 tablet 0   ergocalciferol  (VITAMIN D2) 1.25 MG (50000 UT) capsule Take 1 capsule (50,000 Units total) by mouth once a week. 12 capsule 1   erythromycin  ophthalmic ointment Place a 1/2 inch ribbon of ointment into the lower eyelid 4 times daily for 5 days. 3.5 g 0   Insulin  Pen Needle (UNIFINE PENTIPS) 32G X 4 MM MISC Use as directed to inject with Saxenda  100 each 4   lamoTRIgine  (LAMICTAL ) 200 MG tablet TAKE 1 TABLET BY MOUTH EVERY NIGHT AT BEDTIME. IF OUT OF LAMICTAL  MORE THAN 1 WEEK DO NOT RESTART, CALL OFFICE. IF RASH  DEVELOPS, STOP MED AND CALL MD 90 tablet 1   lamoTRIgine  (LAMICTAL ) 200 MG tablet Take 1 tablet by mouth at bedtime (if out of lamictal  more than 1 week do not restart, call office)(if rash develops, stop med and call office) 90 tablet 2   lansoprazole  (PREVACID ) 15 MG capsule Take 2 capsules (30 mg total) by mouth daily. 720 capsule 0   lansoprazole  (PREVACID ) 30 MG capsule      lansoprazole  (PREVACID ) 30 MG capsule Take 1 capsule (30 mg total) by mouth daily. 30 capsule 11   methylphenidate  (METADATE  CD) 20 MG CR capsule Take 1 capsule (20 mg total) by mouth  every morning. 30 capsule 0   methylphenidate  (METADATE  CD) 20 MG CR capsule Take 1 capsule (20 mg total) by mouth daily. 30 capsule 0   methylphenidate  (METADATE  CD) 20 MG CR capsule Take 1 capsule (20 mg total) by mouth every morning. 30 capsule 0   tirzepatide  (ZEPBOUND ) 2.5 MG/0.5ML Pen Inject 2.5 mg into the skin once a week. 2 mL 0   Vilazodone  HCl 20 MG TABS Take 1 tablet (20 mg total) by mouth daily after breakfast. 90 tablet 2   No current facility-administered medications for this visit.    Medication Side Effects: None  Allergies: Allergies[1]  Past Medical History:  Diagnosis Date   Anxiety    Back pain    Bipolar 1 disorder (HCC)    Depression    High blood pressure    Knee pain    Low testosterone      Family History  Problem Relation Age of Onset   Hypertension Father    Cancer Father    Bipolar disorder Father    Depression Father    Obesity Father    Anxiety disorder Sister    Depression Sister    OCD Sister    Drug abuse Brother    Stroke Maternal Grandfather    Hypertension Paternal Grandfather     Social History   Socioeconomic History   Marital status: Married    Spouse name: Maze Corniel   Number of children: 3   Years of education: 14   Highest education level: Tax Adviser degree: occupational, scientist, product/process development, or vocational program  Occupational History   Occupation: MHT    Employer: [employer]  Tobacco Use   Smoking status: Former    Current packs/day: 0.00    Types: Cigarettes    Start date: 2010    Quit date: 2016    Years since quitting: 10.0   Smokeless tobacco: Never  Vaping Use   Vaping status: Never Used  Substance and Sexual Activity   Alcohol use: No    Comment: light social   Drug use: No   Sexual activity: Not Currently    Partners: Female  Other Topics Concern   Not on file  Social History Narrative   Lives in [place] with wife and three children. Enjoys playing and watching sports. Has had previous problems with  gambling and spending money irresponsibly.    Social Drivers of Health   Tobacco Use: Medium Risk (11/19/2023)   Patient History    Smoking Tobacco Use: Former    Smokeless Tobacco Use: Never    Passive Exposure: Not on Actuary Strain: Not on file  Food Insecurity: Not on file  Transportation Needs: Not on file  Physical Activity: Not on file  Stress: Not on file  Social Connections: Not on file  Intimate Partner Violence: Not on file  Depression (PHQ2-9):  High Risk (07/29/2022)   Depression (PHQ2-9)    PHQ-2 Score: 18  Alcohol Screen: Not on file  Housing: Not on file  Utilities: Not on file  Health Literacy: Not on file    Past Medical History, Surgical history, Social history, and Family history were reviewed and updated as appropriate.   Please see review of systems for further details on the patient's review from today.   Objective:   Physical Exam:  There were no vitals taken for this visit.  Physical Exam Constitutional:      General: He is not in acute distress.    Appearance: Normal appearance.  Neurological:     Mental Status: He is alert and oriented to person, place, and time.     Gait: Gait normal.  Psychiatric:        Attention and Perception: Attention and perception normal. He does not perceive auditory or visual hallucinations.        Mood and Affect: Mood and affect normal. Mood is not anxious or depressed. Affect is not labile.        Speech: Speech normal.        Behavior: Behavior normal. Behavior is cooperative.        Thought Content: Thought content normal.        Cognition and Memory: Cognition and memory normal.        Judgment: Judgment normal.     Lab Review:     Component Value Date/Time   NA 140 10/25/2017 1053   K 4.7 10/25/2017 1053   CL 105 10/25/2017 1053   CO2 21 10/25/2017 1053   GLUCOSE 89 10/25/2017 1053   GLUCOSE 97 06/12/2011 1334   BUN 14 10/25/2017 1053   CREATININE 0.92 10/25/2017 1053   CALCIUM  9.3 10/25/2017 1053   PROT 6.9 10/25/2017 1053   ALBUMIN 4.4 10/25/2017 1053   AST 20 10/25/2017 1053   ALT 31 10/25/2017 1053   ALKPHOS 58 10/25/2017 1053   BILITOT 0.2 10/25/2017 1053   GFRNONAA 110 10/25/2017 1053   GFRAA 128 10/25/2017 1053       Component Value Date/Time   WBC 9.3 06/12/2011 1334   RBC 5.08 06/12/2011 1334   HGB 15.3 06/12/2011 1334   HCT 44.6 06/12/2011 1334   PLT 284.0 06/12/2011 1334   MCV 87.8 06/12/2011 1334   MCHC 34.2 06/12/2011 1334   RDW 12.4 06/12/2011 1334   LYMPHSABS 2.2 06/12/2011 1334   MONOABS 0.5 06/12/2011 1334   EOSABS 0.2 06/12/2011 1334   BASOSABS 0.0 06/12/2011 1334    No results found for: POCLITH, LITHIUM   No results found for: PHENYTOIN, PHENOBARB, VALPROATE, CBMZ   .res Assessment: Plan:   Greater than 50% of 30  min video visit time with patient was spent on counseling and coordination of care.  We discussed his report of continued good stability.  No significant psychosocial changes he likes his medication and is requesting no changes today.  Monitor weights weekly. He is practicing good sleep hygiene.  We agreed today to: To continue Wellbutrin  450 mg XL daily. Pt taking three 150 mg tablets per day at same time.  To continue Lamictal  200 mg To continue Viibryd  20 mg daily.  Will report side effects of worsening symptoms promptly To follow-up in 6  months to reassess Provided emergency contact information Reviewed PDMP  Stephen A. Alexina Niccoli, NP    Manjot Hinks was seen today for anxiety, depression, adhd, follow-up, medication refill and patient education.  Diagnoses  and all orders for this visit:  Bipolar 2 disorder (HCC) -     lamoTRIgine  (LAMICTAL ) 200 MG tablet; Take 1 tablet by mouth at bedtime (if out of lamictal  more than 1 week do not restart, call office)(if rash develops, stop med and call office) -     Vilazodone  HCl 20 MG TABS; Take 1 tablet (20 mg total) by mouth daily after breakfast. -      buPROPion  (WELLBUTRIN  XL) 150 MG 24 hr tablet; Take 3 tablets (450 mg total) by mouth in the morning.  Generalized anxiety disorder -     Vilazodone  HCl 20 MG TABS; Take 1 tablet (20 mg total) by mouth daily after breakfast. -     buPROPion  (WELLBUTRIN  XL) 150 MG 24 hr tablet; Take 3 tablets (450 mg total) by mouth in the morning.  Attention deficit hyperactivity disorder (ADHD), combined type -     methylphenidate  (METADATE  CD) 20 MG CR capsule; Take 1 capsule (20 mg total) by mouth every morning.     Please see After Visit Summary for patient specific instructions.  No future appointments.  No orders of the defined types were placed in this encounter.     -------------------------------     [1]  Allergies Allergen Reactions   Erythromycin  Nausea And Vomiting

## 2024-04-25 ENCOUNTER — Other Ambulatory Visit: Payer: Self-pay

## 2024-05-05 ENCOUNTER — Other Ambulatory Visit (HOSPITAL_COMMUNITY): Payer: Self-pay
